# Patient Record
Sex: Female | Born: 1985 | Race: Black or African American | Hispanic: No | Marital: Single | Smoking: Never smoker
Health system: Southern US, Community
[De-identification: ages and names within clinical notes are randomized; demographics above are authoritative.]

## PROBLEM LIST (undated history)

## (undated) ENCOUNTER — Inpatient Hospital Stay (HOSPITAL_COMMUNITY): Payer: Self-pay

## (undated) DIAGNOSIS — I10 Essential (primary) hypertension: Secondary | ICD-10-CM

## (undated) DIAGNOSIS — D649 Anemia, unspecified: Secondary | ICD-10-CM

---
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
## 2004-09-11 ENCOUNTER — Emergency Department (HOSPITAL_COMMUNITY): Admission: EM | Admit: 2004-09-11 | Discharge: 2004-09-11 | Payer: Self-pay | Admitting: Emergency Medicine

## 2005-08-01 ENCOUNTER — Emergency Department (HOSPITAL_COMMUNITY): Admission: EM | Admit: 2005-08-01 | Discharge: 2005-08-01 | Payer: Self-pay | Admitting: Emergency Medicine

## 2005-08-05 ENCOUNTER — Other Ambulatory Visit: Admission: RE | Admit: 2005-08-05 | Discharge: 2005-08-05 | Payer: Self-pay | Admitting: Gynecology

## 2005-08-20 ENCOUNTER — Emergency Department (HOSPITAL_COMMUNITY): Admission: EM | Admit: 2005-08-20 | Discharge: 2005-08-20 | Payer: Self-pay | Admitting: Emergency Medicine

## 2006-03-27 ENCOUNTER — Emergency Department (HOSPITAL_COMMUNITY): Admission: EM | Admit: 2006-03-27 | Discharge: 2006-03-27 | Payer: Self-pay | Admitting: Emergency Medicine

## 2007-03-03 ENCOUNTER — Inpatient Hospital Stay (HOSPITAL_COMMUNITY): Admission: AD | Admit: 2007-03-03 | Discharge: 2007-03-03 | Payer: Self-pay | Admitting: Obstetrics and Gynecology

## 2007-09-09 ENCOUNTER — Inpatient Hospital Stay (HOSPITAL_COMMUNITY): Admission: AD | Admit: 2007-09-09 | Discharge: 2007-09-12 | Payer: Self-pay | Admitting: Obstetrics

## 2011-02-04 NOTE — Op Note (Signed)
Sarah Rios, Sarah Rios              ACCOUNT NO.:  [id number]   MEDICAL RECORD NO.:  [id number]          PATIENT TYPE:  INP   LOCATION:  9135                          FACILITY:  [facility]   PHYSICIAN:  Kathreen Cosier, M.D.DATE OF BIRTH:  1986/07/21   DATE OF PROCEDURE:  09/09/2007  DATE OF DISCHARGE:                               OPERATIVE REPORT   PREOPERATIVE DIAGNOSIS:  Previous cesarean section at term, in labor,  desires repeat.   POSTOPERATIVE DIAGNOSIS:  Previous cesarean section at term, in labor,  desires repeat.   SURGEON:  Kathreen Cosier, M.D.   FIRST ASSISTANT:  Charles A. Clearance Coots, M.D.   ANESTHESIA:  Spinal.   PROCEDURE IN DETAIL:  The patient was placed on the operating table in a  supine position after spinal administered.  Abdomen prepped and draped.  Bladder emptied with Foley catheter.  Transverse suprapubic incision  made through the old scar, carried down to rectus fascia.  Fascia  cleaned and incised the length of the incision.  Recti muscles retracted  laterally.  Peritoneum incised longitudinally.  Transverse incision made  in the visceral peritoneum above the bladder.  Bladder mobilized  inferiorly.  Transverse lower uterine incision made.  Fluid was meconium  stained.  Team was in attendance.  The Apgars 9 and 9.  We had a female  weighing 8 pounds 6 ounces from the OP position.  Placenta was posterior  and was removed manually and sent to labor and delivery.  Uterine cavity  cleaned with dry laps.  Uterine incision closed in 1 layer with  continuous suture of #1 chromic.  Hemostasis satisfactory.  Bladder flap  reattached with 2-0 chromic.  Uterus well contracted.  Tubes and ovaries  normal.  Abdomen closed in layers.  Peritoneum continuous suture of 0  chromic.  Fascia continuous suture of 0 Dexon and the skin closed with  subcuticular stitch of 4-0 Monocryl.  Addendum, the placenta was sent to  pathology.  Blood loss 800 mL.     ______________________________  Kathreen Cosier, M.D.     BAM/MEDQ  D:  09/09/2007  T:  09/10/2007  Job:  [id number]

## 2011-02-07 NOTE — Discharge Summary (Signed)
Sarah Rios, Sarah Rios              ACCOUNT NO.:  [id number]   MEDICAL RECORD NO.:  [id number]          PATIENT TYPE:  INP   LOCATION:  9135                          [facility]ILITY:  [facility]   PHYSICIAN:  Kathreen Cosier, M.D.DATE OF BIRTH:  1986-08-20   DATE OF ADMISSION:  09/09/2007  DATE OF DISCHARGE:  09/12/2007                               DISCHARGE SUMMARY   The patient is a 25 year old gravida 3, para 1-0-1-1, [facility] September 07, 2007.  She had a previous C-section and she was now at term and desired  repeat C-section.  On December 18, she underwent repeat low transverse  cesarean section.  She had a female, Apgars 9 and 9, from the OP position,  weighing 8 pounds 6 ounces.  Postoperatively, she did well.  Her  hemoglobin was 8.4, RPR negative, HIV negative, and she was started on  ferrous sulfate twice a day.  She was discharged on the third  postoperative day, ambulatory, on a regular diet, to see me in six  weeks.   DISCHARGE MEDICATIONS:  Ferrous sulfate twice a day and Tylox for pain.   DISCHARGE DIAGNOSES:  Status post repeat low transverse cesarean section  at term.           ______________________________  Kathreen Cosier, M.D.     BAM/MEDQ  D:  09/29/2007  T:  09/29/2007  Job:  [id number]

## 2011-06-27 LAB — CBC
HCT: 25.2 — ABNORMAL LOW
HCT: 35.8 — ABNORMAL LOW
Hemoglobin: 11.6 — ABNORMAL LOW
Hemoglobin: 8.4 — ABNORMAL LOW
MCHC: 32.4
MCHC: 33.5
MCV: 74.8 — ABNORMAL LOW
MCV: 75.7 — ABNORMAL LOW
Platelets: 156
Platelets: 197
RBC: 3.37 — ABNORMAL LOW
RBC: 4.73
RDW: 15.8 — ABNORMAL HIGH
RDW: 15.9 — ABNORMAL HIGH
WBC: 13.1 — ABNORMAL HIGH
WBC: 14 — ABNORMAL HIGH

## 2011-06-27 LAB — RPR: RPR Ser Ql: NONREACTIVE

## 2011-07-03 ENCOUNTER — Encounter (HOSPITAL_COMMUNITY): Payer: Self-pay

## 2011-07-03 ENCOUNTER — Emergency Department (HOSPITAL_COMMUNITY): Payer: BC Managed Care – PPO

## 2011-07-03 ENCOUNTER — Emergency Department (HOSPITAL_COMMUNITY)
Admission: EM | Admit: 2011-07-03 | Discharge: 2011-07-03 | Disposition: A | Discharge status: Home / Self Care | Payer: BC Managed Care – PPO | Attending: Emergency Medicine | Admitting: Emergency Medicine

## 2011-07-03 DIAGNOSIS — J189 Pneumonia, unspecified organism: Secondary | ICD-10-CM | POA: Insufficient documentation

## 2011-07-03 DIAGNOSIS — Z79899 Other long term (current) drug therapy: Secondary | ICD-10-CM | POA: Insufficient documentation

## 2011-07-03 DIAGNOSIS — I10 Essential (primary) hypertension: Secondary | ICD-10-CM | POA: Insufficient documentation

## 2011-07-03 DIAGNOSIS — R51 Headache: Secondary | ICD-10-CM | POA: Insufficient documentation

## 2011-07-03 LAB — URINALYSIS, ROUTINE W REFLEX MICROSCOPIC
Bilirubin Urine: NEGATIVE
Glucose, UA: NEGATIVE mg/dL
Hgb urine dipstick: NEGATIVE
Ketones, ur: NEGATIVE mg/dL
Nitrite: NEGATIVE
Protein, ur: NEGATIVE mg/dL
Specific Gravity, Urine: 1.016 (ref 1.005–1.030)
Urobilinogen, UA: 1 mg/dL (ref 0.0–1.0)
pH: 7 (ref 5.0–8.0)

## 2011-07-03 LAB — POCT PREGNANCY, URINE: Preg Test, Ur: NEGATIVE

## 2011-07-03 LAB — POCT I-STAT, CHEM 8
BUN: 6 mg/dL (ref 6–23)
Calcium, Ion: 1.2 mmol/L (ref 1.12–1.32)
Chloride: 106 mEq/L (ref 96–112)
Glucose, Bld: 88 mg/dL (ref 70–99)
HCT: 36 % (ref 36.0–46.0)
Hemoglobin: 12.2 g/dL (ref 12.0–15.0)
Potassium: 3.7 mEq/L (ref 3.5–5.1)
Sodium: 139 mEq/L (ref 135–145)

## 2011-07-03 LAB — URINE MICROSCOPIC-ADD ON

## 2011-07-03 LAB — POCT I-STAT TROPONIN I: Troponin i, poc: 0 ng/mL (ref 0.00–0.08)

## 2011-07-03 LAB — RAPID STREP SCREEN (MED CTR MEBANE ONLY): Streptococcus, Group A Screen (Direct): NEGATIVE

## 2011-07-03 MED ORDER — IOHEXOL 300 MG/ML  SOLN
100.0000 mL | Freq: Once | INTRAMUSCULAR | Status: AC | PRN
  Administered 2011-07-03: 100 mL via INTRAVENOUS

## 2011-07-10 LAB — URINALYSIS, ROUTINE W REFLEX MICROSCOPIC
Bilirubin Urine: NEGATIVE
Glucose, UA: NEGATIVE
Hgb urine dipstick: NEGATIVE
Ketones, ur: NEGATIVE
Nitrite: NEGATIVE
Protein, ur: NEGATIVE
Specific Gravity, Urine: 1.025
Urobilinogen, UA: 0.2
pH: 6

## 2011-07-10 LAB — GC/CHLAMYDIA PROBE AMP, GENITAL
Chlamydia, DNA Probe: NEGATIVE
GC Probe Amp, Genital: NEGATIVE

## 2011-07-10 LAB — WET PREP, GENITAL
Clue Cells Wet Prep HPF POC: NONE SEEN
Trich, Wet Prep: NONE SEEN

## 2011-07-10 LAB — URINE MICROSCOPIC-ADD ON

## 2012-01-01 ENCOUNTER — Encounter (HOSPITAL_COMMUNITY): Payer: Self-pay | Admitting: *Deleted

## 2012-01-01 ENCOUNTER — Emergency Department (HOSPITAL_COMMUNITY): Payer: BC Managed Care – PPO

## 2012-01-01 ENCOUNTER — Emergency Department (HOSPITAL_COMMUNITY)
Admission: EM | Admit: 2012-01-01 | Discharge: 2012-01-01 | Disposition: A | Discharge status: Home / Self Care | Payer: BC Managed Care – PPO | Attending: Emergency Medicine | Admitting: Emergency Medicine

## 2012-01-01 DIAGNOSIS — I10 Essential (primary) hypertension: Secondary | ICD-10-CM | POA: Insufficient documentation

## 2012-01-01 DIAGNOSIS — R0789 Other chest pain: Secondary | ICD-10-CM

## 2012-01-01 DIAGNOSIS — R071 Chest pain on breathing: Secondary | ICD-10-CM | POA: Insufficient documentation

## 2012-01-01 DIAGNOSIS — R110 Nausea: Secondary | ICD-10-CM | POA: Insufficient documentation

## 2012-01-01 DIAGNOSIS — Z79899 Other long term (current) drug therapy: Secondary | ICD-10-CM | POA: Insufficient documentation

## 2012-01-01 HISTORY — DX: Essential (primary) hypertension: I10

## 2012-01-01 LAB — CBC
HCT: 36.1 % (ref 36.0–46.0)
Hemoglobin: 11.3 g/dL — ABNORMAL LOW (ref 12.0–15.0)
MCH: 22.2 pg — ABNORMAL LOW (ref 26.0–34.0)
MCHC: 31.3 g/dL (ref 30.0–36.0)
MCV: 71.1 fL — ABNORMAL LOW (ref 78.0–100.0)
Platelets: 245 10*3/uL (ref 150–400)
RBC: 5.08 MIL/uL (ref 3.87–5.11)
RDW: 16.9 % — ABNORMAL HIGH (ref 11.5–15.5)
WBC: 5.2 10*3/uL (ref 4.0–10.5)

## 2012-01-01 LAB — DIFFERENTIAL
Basophils Relative: 0 % (ref 0–1)
Eosinophils Absolute: 0.1 10*3/uL (ref 0.0–0.7)
Lymphs Abs: 1.5 10*3/uL (ref 0.7–4.0)
Neutro Abs: 3 10*3/uL (ref 1.7–7.7)
Neutrophils Relative %: 58 % (ref 43–77)

## 2012-01-01 LAB — BASIC METABOLIC PANEL
BUN: 9 mg/dL (ref 6–23)
Calcium: 9.6 mg/dL (ref 8.4–10.5)
Chloride: 102 mEq/L (ref 96–112)
Creatinine, Ser: 0.75 mg/dL (ref 0.50–1.10)
GFR calc Af Amer: >90 mL/min (ref >90)
GFR calc non Af Amer: >90 mL/min (ref >90)
Potassium: 3.7 mEq/L (ref 3.5–5.1)
Sodium: 136 mEq/L (ref 135–145)

## 2012-01-01 LAB — URINALYSIS, ROUTINE W REFLEX MICROSCOPIC
Bilirubin Urine: NEGATIVE
Ketones, ur: NEGATIVE mg/dL
Nitrite: NEGATIVE
Urobilinogen, UA: 0.2 mg/dL (ref 0.0–1.0)

## 2012-01-01 LAB — TROPONIN I: Troponin I: <0.3 ng/mL (ref <0.30)

## 2012-01-01 LAB — D-DIMER, QUANTITATIVE: D-Dimer, Quant: 0.4 ug/mL-FEU (ref 0.00–0.48)

## 2012-01-01 MED ORDER — IOHEXOL 300 MG/ML  SOLN
100.0000 mL | Freq: Once | INTRAMUSCULAR | Status: AC | PRN
  Administered 2012-01-01: 100 mL via INTRAVENOUS

## 2012-01-01 MED ORDER — GI COCKTAIL ~~LOC~~
30.0000 mL | Freq: Once | ORAL | Status: AC
  Administered 2012-01-01: 30 mL via ORAL
  Filled 2012-01-01: qty 30

## 2012-01-01 MED ORDER — KETOROLAC TROMETHAMINE 60 MG/2ML IM SOLN
60.0000 mg | Freq: Once | INTRAMUSCULAR | Status: AC
  Administered 2012-01-01: 60 mg via INTRAMUSCULAR
  Filled 2012-01-01: qty 2

## 2012-01-01 MED ORDER — METHOCARBAMOL 500 MG PO TABS: 1000.0000 mg | ORAL_TABLET | Freq: Four times a day (QID) | ORAL | Status: AC | PRN

## 2012-01-01 MED ORDER — HYDROCODONE-ACETAMINOPHEN 5-325 MG PO TABS: ORAL_TABLET | ORAL | Status: AC

## 2012-01-01 NOTE — ED Provider Notes (Addendum)
History     CSN: [id number]  Arrival date & time 01/01/12  0800   First MD Initiated Contact with Patient 01/01/12 [phone]      Chief Complaint  Patient presents with  . Chest Pain     HPI Pt was seen at 0840.  Per pt, c/o gradual onset and persistence of constant left upper chest "pain" x2 days.  Pain worsens with laying down flat or laying on her left side.  Pt describes the pain as "heavy" and "pressure."  States her CP has been assoc with nausea and loose stools x3 days.  Denies black or blood in stools, no tingling/numbness in extremities, no neck or back pain, no palpitations, no SOB/cough, no fevers, no rash.       Past Medical History  Diagnosis Date  . Hypertension     Past Surgical History  Procedure Date  . Cesarean section     History  Substance Use Topics  . Smoking status: Never Smoker   . Smokeless tobacco: Not on file  . Alcohol Use: Yes     occa    Review of Systems ROS: Statement: All systems negative except as marked or noted in the HPI; Constitutional: Negative for fever and chills. ; ; Eyes: Negative for eye pain, redness and discharge. ; ; ENMT: Negative for ear pain, hoarseness, nasal congestion, sinus pressure and sore throat. ; ; Cardiovascular: +chest pain. Negative for palpitations, diaphoresis, dyspnea and peripheral edema. ; ; Respiratory: Negative for cough, wheezing and stridor. ; ; Gastrointestinal: +nausea, loose stools.  Negative for vomiting, diarrhea, abdominal pain, blood in stool, hematemesis, jaundice and rectal bleeding. . ; ; Genitourinary: Negative for dysuria, flank pain and hematuria. ; ; Musculoskeletal: Negative for back pain and neck pain. Negative for swelling and trauma.; ; Skin: Negative for pruritus, rash, abrasions, blisters, bruising and skin lesion.; ; Neuro: Negative for headache, lightheadedness and neck stiffness. Negative for weakness, altered level of consciousness , altered mental status, extremity weakness, paresthesias,  involuntary movement, seizure and syncope.     Allergies  Review of patient's allergies indicates no known allergies.  Home Medications   Current Outpatient Rx  Name Route Sig Dispense Refill  . IBUPROFEN 200 MG PO TABS Oral Take 600 mg by mouth every 6 (six) hours as needed. For pain    . METOPROLOL TARTRATE 25 MG PO TABS Oral Take 25 mg by mouth daily.      BP 126/75  Pulse 72  Temp(Src) 98.5 F (36.9 C) (Oral)  Resp 16  SpO2 100%  LMP 12/08/2011  Physical Exam 0845: Physical examination:  Nursing notes reviewed; Vital signs and O2 SAT reviewed;  Constitutional: Well developed, Well nourished, Well hydrated, In no acute distress; Head:  Normocephalic, atraumatic; Eyes: EOMI, PERRL, No scleral icterus; ENMT: TM's clear bilat. Mouth and pharynx normal, Mucous membranes moist; Neck: Supple, Full range of motion, No lymphadenopathy; Cardiovascular: Regular rate and rhythm, No murmur, rub, or gallop; Respiratory: Breath sounds clear & equal bilaterally, No rales, rhonchi, wheezes, or rub, Normal respiratory effort/excursion; Chest: +TTP left upper parasternal and anterior chest wall areas, no rash, Movement normal; Abdomen: Soft, Nontender, Nondistended, Normal bowel sounds; Extremities: Pulses normal, No tenderness, No edema, No calf edema or asymmetry.; Neuro: AA&Ox3, Major CN grossly intact.  No gross focal motor or sensory deficits in extremities.; Skin: Color normal, Warm, Dry, no rash.     ED Course  Procedures   1045:  Pt improved after meds.  States she did not  f/u after her last ED visit for CP on 06/2011 for repeat CT chest re: possible mass.  Will check today.  12:39 PM:  Continues improved, VS remain stable.  Has tol PO well.  No N/V or stooling while in ED, abd benign on exam.  Wants to go home now.  Doubt ACS as cause for symptoms given negative troponin and unchanged EKG after 2 days of constant symptoms.  Doubt PE with negative d-dimer, no hypoxia/tachycardia/tachypnea  on exam today, as well as grossly negative CT chest for PE as noted below.  Will tx symptomatically for now.  Pt encouraged to f/u with her PMD for good continuity of care.  Dx testing d/w pt.  Questions answered.  Verb understanding, agreeable to d/c home with outpt f/u.    MDM  MDM Reviewed: nursing note, previous chart and vitals Reviewed previous: ECG and CT scan Interpretation: ECG, labs and x-ray    Date: 01/01/2012  Rate: 74  Rhythm: normal sinus rhythm  QRS Axis: normal  Intervals: normal  ST/T Wave abnormalities: normal  Conduction Disutrbances:none  Narrative Interpretation:   Old EKG Reviewed: unchanged; no significant changes from previous EKG dated 07/03/2011.  Results for orders placed during the hospital encounter of 01/01/12  BASIC METABOLIC PANEL      Component Value Range   Sodium 136  135 - 145 (mEq/L)   Potassium 3.7  3.5 - 5.1 (mEq/L)   Chloride 102  96 - 112 (mEq/L)   CO2 28  19 - 32 (mEq/L)   Glucose, Bld 78  70 - 99 (mg/dL)   BUN 9  6 - 23 (mg/dL)   Creatinine, Ser 9.56  0.50 - 1.10 (mg/dL)   Calcium 9.6  8.4 - 21.3 (mg/dL)   GFR calc non Af Amer >90  >90 (mL/min)   GFR calc Af Amer >90  >90 (mL/min)  CBC      Component Value Range   WBC 5.2  4.0 - 10.5 (K/uL)   RBC 5.08  3.87 - 5.11 (MIL/uL)   Hemoglobin 11.3 (*) 12.0 - 15.0 (g/dL)   HCT 08.6  57.8 - 46.9 (%)   MCV 71.1 (*) 78.0 - 100.0 (fL)   MCH 22.2 (*) 26.0 - 34.0 (pg)   MCHC 31.3  30.0 - 36.0 (g/dL)   RDW 62.9 (*) 52.8 - 15.5 (%)   Platelets 245  150 - 400 (K/uL)  DIFFERENTIAL      Component Value Range   Neutrophils Relative 58  43 - 77 (%)   Neutro Abs 3.0  1.7 - 7.7 (K/uL)   Lymphocytes Relative 29  12 - 46 (%)   Lymphs Abs 1.5  0.7 - 4.0 (K/uL)   Monocytes Relative 10  3 - 12 (%)   Monocytes Absolute 0.5  0.1 - 1.0 (K/uL)   Eosinophils Relative 3  0 - 5 (%)   Eosinophils Absolute 0.1  0.0 - 0.7 (K/uL)   Basophils Relative 0  0 - 1 (%)   Basophils Absolute 0.0  0.0 - 0.1 (K/uL)    D-DIMER, QUANTITATIVE      Component Value Range   D-Dimer, Quant 0.40  0.00 - 0.48 (ug/mL-FEU)  TROPONIN I      Component Value Range   Troponin I <0.30  <0.30 (ng/mL)  URINALYSIS, ROUTINE W REFLEX MICROSCOPIC      Component Value Range   Color, Urine YELLOW  YELLOW    APPearance CLOUDY (*) CLEAR    Specific Gravity, Urine 1.024  1.005 -  1.030    pH 8.0  5.0 - 8.0    Glucose, UA NEGATIVE  NEGATIVE (mg/dL)   Hgb urine dipstick NEGATIVE  NEGATIVE    Bilirubin Urine NEGATIVE  NEGATIVE    Ketones, ur NEGATIVE  NEGATIVE (mg/dL)   Protein, ur NEGATIVE  NEGATIVE (mg/dL)   Urobilinogen, UA 0.2  0.0 - 1.0 (mg/dL)   Nitrite NEGATIVE  NEGATIVE    Leukocytes, UA NEGATIVE  NEGATIVE   PREGNANCY, URINE      Component Value Range   Preg Test, Ur NEGATIVE  NEGATIVE    Dg Chest 2 View 01/01/2012  *RADIOLOGY REPORT*  Clinical Data: Left chest pain.  CHEST - 2 VIEW  Comparison: 07/03/2011  Findings: Heart and mediastinal contours are within normal limits. No focal opacities or effusions.  No acute bony abnormality.  IMPRESSION: No active cardiopulmonary disease.  Original Report Authenticated By: Cyndie Chime, M.D.   Ct Chest W Contrast 01/01/2012  *RADIOLOGY REPORT*  Clinical Data: Chest pain, history of abnormal CT  CT CHEST WITH CONTRAST  Technique:  Multidetector CT imaging of the chest was performed following the standard protocol during bolus administration of intravenous contrast.  Contrast: OMNIPAQUE IOHEXOL 300 MG/ML  SOLN  Comparison: Chest CT - 07/03/2011  Findings:  Interval resolution of previously noted airspace opacity within the medial aspect of the left upper lobe.  No new focal airspace opacities.  No pleural effusion or pneumothorax. No mediastinal, hilar or axillary lymphadenopathy.  Normal heart size.  No pericardial effusion.  Evaluation of the ascending aorta is limited secondary to pulsation artifact.  Normal configuration of the aortic arch.  The visualized portions of the  cervical vasculature patent.  Normal caliber of the thoracic aorta.  Although this examination was not tailored for evaluation of the pulmonary arteries, there is no discrete filling defect within the main pulmonary arteries to suggest acute pulmonary embolism. Normal caliber of the main pulmonary artery.  Limited evaluation of the upper abdomen is normal.  No acute or aggressive osseous abnormality.  IMPRESSION: 1.  Interval resolution of the left upper lobe airspace opacity compatible with resolved infection.  2.  No acute cardiopulmonary disease.  Original Report Authenticated By: Waynard Reeds, M.D.                Laray Anger, DO 01/02/12 2019

## 2012-01-01 NOTE — ED Notes (Signed)
Pt. States pain started Tues. Worse lying down or lying on side. No pain during the day yesterday. "Faint pain right now. Would feel like a brick on my chest if I laid down". No SOB. Pt states "I had nausea earlier this morning, but not right now". No vomiting.  Pt. Reports loose stools x 3 days. Headache on left side above eye. A.o.x3 NAD.

## 2012-01-01 NOTE — Discharge Instructions (Signed)
RESOURCE GUIDE  Dental Problems  Patients with Medicaid: [facility]                     [facility] [address]                                           [address] Phone:  [phone]                                                  Phone:  [phone]  If unable to pay or uninsured, contact:  [facility] or [facility] to become qualified for the adult dental clinic.  Chronic Pain Problems Contact [facility]  [phone] Patients need to be referred by their primary care doctor.  Insufficient Money for Medicine Contact [employer]:  call "211" or [facility] 271-5999.  No Primary Care Doctor Call [facility]  832-8000 Other agencies that provide inexpensive medical care    [facility]  [phone]    [facility]  [phone]    [facility]  [phone]    Women[facility] Clinic  [phone]    [facility]  [phone]    [facility]  [phone]  Psychological Services [facility]  [phone] [facility]  [phone] [facility]   [phone] (emergency services 641-4993)  Substance Abuse Resources [facility]  336-882-2125 [facility] 336-784-9470 [facility] 336-285-9073 [facility] 336-845-3988 Residential & Outpatient Substance Abuse Program  800-659-3381  Abuse/Neglect [place] Child Abuse Hotline (336) 641-3795 [place] Child Abuse Hotline 800-378-5315 (After Hours)  Emergency Shelter [facility] (336) 271-5985  Maternity Homes [facility] (336) 275-9566 [facility] (704) 372-4663  MRSA Hotline #:   832-7006    [place] [facility]  [facility] [place]     [employer]                          [facility] [address] [place]                       [address]      [address]  [place]                                                [place]                            [place] Phone:  [phone]                                   Phone:  [phone]                 Phone:  [phone]  [facility] Phone:  [phone]    [facility] Child Abuse Hotline 925 804 3606 (531)033-0598 (After Hours)   Take the prescriptions as directed.  Apply moist heat or ice to the area(s) of discomfort, for 15 minutes at a time, several times per day for the next few days.  Do not fall asleep on a heating or ice pack.  Call your regular medical doctor today to schedule a follow up appointment within the next 4 days.  Return to the Emergency Department immediately if worsening.

## 2012-01-01 NOTE — ED Notes (Signed)
Pt reports mid-upper chest pain that started Tuesday night, pain is worse when laying down or when laying on her side.  Pt reports no pain during the day.  Pt denies any SOB or dizziness at this time.  Pt reports this am, pain is still there and developed h/a with it.  Pt denies any chest at present but reports h/a,.

## 2013-04-22 ENCOUNTER — Encounter (HOSPITAL_COMMUNITY): Payer: Self-pay

## 2013-04-22 ENCOUNTER — Emergency Department (HOSPITAL_COMMUNITY)
Admission: EM | Admit: 2013-04-22 | Discharge: 2013-04-22 | Disposition: A | Discharge status: Home / Self Care | Payer: BC Managed Care – PPO | Attending: Emergency Medicine | Admitting: Emergency Medicine

## 2013-04-22 DIAGNOSIS — R209 Unspecified disturbances of skin sensation: Secondary | ICD-10-CM | POA: Insufficient documentation

## 2013-04-22 DIAGNOSIS — M792 Neuralgia and neuritis, unspecified: Secondary | ICD-10-CM

## 2013-04-22 DIAGNOSIS — I10 Essential (primary) hypertension: Secondary | ICD-10-CM | POA: Insufficient documentation

## 2013-04-22 DIAGNOSIS — Z79899 Other long term (current) drug therapy: Secondary | ICD-10-CM | POA: Insufficient documentation

## 2013-04-22 DIAGNOSIS — IMO0002 Reserved for concepts with insufficient information to code with codable children: Secondary | ICD-10-CM | POA: Insufficient documentation

## 2013-04-22 MED ORDER — IBUPROFEN 800 MG PO TABS
800.0000 mg | ORAL_TABLET | Freq: Once | ORAL | Status: AC
  Administered 2013-04-22: 800 mg via ORAL
  Filled 2013-04-22: qty 1

## 2013-04-22 MED ORDER — NAPROXEN 500 MG PO TABS: 500.0000 mg | ORAL_TABLET | Freq: Two times a day (BID) | ORAL | Status: DC

## 2013-04-22 MED ORDER — CYCLOBENZAPRINE HCL 10 MG PO TABS: 10.0000 mg | ORAL_TABLET | Freq: Three times a day (TID) | ORAL | Status: DC | PRN

## 2013-04-22 NOTE — ED Provider Notes (Signed)
CSN: [id number]     Arrival date & time 04/22/13  0000 History     First MD Initiated Contact with Patient 04/22/13 [phone]     Chief Complaint  Patient presents with  . Arm Pain   HPI  History provided by the patient. The patient is a 27 year old female with history of hypertension who presents with complaints of right upper extremity pain and slight numbness. Symptoms came on acutely around 9 PM while she was at home at rest. Pain travels from her right shoulder, back and all laydown her arm to the forearm. Denies pain in the hand or fingers. There is a slight tingling and numbness sensation. Patient has never had similar symptoms previously. Denies any injury or trauma. The patient did to some increased activity with her arms earlier braiding hair but did not fill this caused any particular injury or strain. Pains are worse with some movements and with palpation of the arm. She has not used any medications for symptoms. No other aggravating or alleviating factors. She denies any chest pain, shortness of breath, neck pain or stiffness. No headache. No recent fever, chills or sweats.    Past Medical History  Diagnosis Date  . Hypertension    Past Surgical History  Procedure Laterality Date  . Cesarean section     Family History  Problem Relation Age of Onset  . Adopted: Yes   History  Substance Use Topics  . Smoking status: Never Smoker   . Smokeless tobacco: Not on file  . Alcohol Use: Yes     Comment: occa   OB History   Grav Para Term Preterm Abortions TAB SAB Ect Mult Living                 Review of Systems  Constitutional: Negative for fever, chills and diaphoresis.  Respiratory: Negative for cough and shortness of breath.   Cardiovascular: Negative for chest pain, palpitations and leg swelling.  Gastrointestinal: Negative for nausea.  All other systems reviewed and are negative.    Allergies  Review of patient's allergies indicates no known allergies.  Home  Medications   Current Outpatient Rx  Name  Route  Sig  Dispense  Refill  . ibuprofen (ADVIL,MOTRIN) 200 MG tablet   Oral   Take 600 mg by mouth every 6 (six) hours as needed. For pain         . metoprolol tartrate (LOPRESSOR) 25 MG tablet   Oral   Take 25 mg by mouth daily.          BP 165/86  Pulse 77  Temp(Src) 98.4 F (36.9 C) (Oral)  Resp 18  Ht 5\' 1"  (1.549 m)  Wt 190 lb (86.183 kg)  BMI 35.92 kg/m2  SpO2 100%  LMP 04/21/2013 Physical Exam  Nursing note and vitals reviewed. Constitutional: She is oriented to person, place, and time. She appears well-developed and well-nourished. No distress.  HENT:  Head: Normocephalic.  Neck: Normal range of motion. Neck supple.  No cervical midline tenderness  Cardiovascular: Normal rate and regular rhythm.   No murmur heard. Pulmonary/Chest: Effort normal and breath sounds normal. No respiratory distress. She has no wheezes. She has no rales.  Abdominal: Soft.  Musculoskeletal: She exhibits tenderness. She exhibits no edema.  Mild to moderate tenderness along the right forearm and upper arm. Moderate to more significant tenderness along the right trapezius. There is no deformity or acute spasm. No cervical paraspinous tenderness.  Neurological: She is alert and oriented to  person, place, and time.  Skin: Skin is warm and dry. No rash noted.  Psychiatric: She has a normal mood and affect. Her behavior is normal.    ED Course   Procedures    1. Radicular pain in right arm     MDM  Patient seen and evaluated. Patient appears well she is holding her right arm and appears in moderate discomfort. No acute distress.  Patient states she was mostly concerned for any possibilities of a heart attack. Denies any other symptoms besides arm pain. She does have history of hypertension but no other significant risk factors. Have agreed with her to perform ECG.      Date: 04/22/2013  Rate: 72  Rhythm: normal sinus rhythm  QRS  Axis: normal  Intervals: normal  ST/T Wave abnormalities: normal  Conduction Disutrbances:none  Narrative Interpretation:   Old EKG Reviewed: unchanged    Angus Seller, PA-C 04/22/13 [phone]

## 2013-04-22 NOTE — ED Notes (Signed)
Pt complains of right arm pain since 9pm, no injury noted

## 2013-04-22 NOTE — ED Provider Notes (Signed)
Medical screening examination/treatment/procedure(s) were performed by non-physician practitioner and as supervising physician I was immediately available for consultation/collaboration.  Duanne Duchesne M Donevin Sainsbury, MD 04/22/13 0524 

## 2015-12-28 ENCOUNTER — Emergency Department (HOSPITAL_BASED_OUTPATIENT_CLINIC_OR_DEPARTMENT_OTHER)
Admission: EM | Admit: 2015-12-28 | Discharge: 2015-12-28 | Disposition: A | Discharge status: Home / Self Care | Payer: BLUE CROSS/BLUE SHIELD | Attending: Emergency Medicine | Admitting: Emergency Medicine

## 2015-12-28 ENCOUNTER — Encounter (HOSPITAL_BASED_OUTPATIENT_CLINIC_OR_DEPARTMENT_OTHER): Payer: Self-pay | Admitting: *Deleted

## 2015-12-28 DIAGNOSIS — Z791 Long term (current) use of non-steroidal anti-inflammatories (NSAID): Secondary | ICD-10-CM | POA: Diagnosis not present

## 2015-12-28 DIAGNOSIS — I159 Secondary hypertension, unspecified: Secondary | ICD-10-CM | POA: Diagnosis not present

## 2015-12-28 DIAGNOSIS — I10 Essential (primary) hypertension: Secondary | ICD-10-CM | POA: Diagnosis present

## 2015-12-28 MED ORDER — METOPROLOL TARTRATE 50 MG PO TABS
50.0000 mg | ORAL_TABLET | Freq: Once | ORAL | Status: AC
  Administered 2015-12-28: 50 mg via ORAL
  Filled 2015-12-28: qty 1

## 2015-12-28 MED ORDER — METOPROLOL TARTRATE 50 MG PO TABS: 50.0000 mg | ORAL_TABLET | Freq: Every day | ORAL | Status: DC

## 2015-12-28 NOTE — ED Notes (Signed)
She ran out of her BP medication 2 weeks ago. [facility] and her BP was elevated and she was told to come here.

## 2015-12-28 NOTE — ED Provider Notes (Signed)
CSN: [id number]     Arrival date & time 12/28/15  1843 History  By signing my name below, I, [name], attest that this documentation has been prepared under the direction and in the presence of Marily MemosJason Kobee Medlen, MD. Electronically Signed: [name], ED Scribe. 12/28/2015. 7:18 PM.    Chief Complaint  Patient presents with  . Hypertension   The history is provided by the patient. No language interpreter was used.   HPI Comments: Sarah Rios is a 30 y.o. female with h/o HTN on 50 mg qd Lopressor who presents to the Emergency Department complaining of mild HA onset today with associated right arm paresthesia. Per pt, she ran out of her metoprolol 2 weeks ago and was unable to schedule an appointment with her PCP, so she went to the [facility] for a refill today. She was then referred here after her BP was elevated and she complained of HA. Pt states her current HA is typical for her with HTN. Denies CP, visual disturbance, SOB, dysuria, leg swelling.   Past Medical History  Diagnosis Date  . Hypertension    Past Surgical History  Procedure Laterality Date  . Cesarean section     Family History  Problem Relation Age of Onset  . Adopted: Yes   Social History  Substance Use Topics  . Smoking status: Never Smoker   . Smokeless tobacco: None  . Alcohol Use: Yes     Comment: occa   OB History    No data available     Review of Systems  Eyes: Negative for visual disturbance.  Respiratory: Negative for shortness of breath.   Cardiovascular: Negative for chest pain and leg swelling.  Genitourinary: Negative for dysuria.  Neurological: Positive for headaches.  All other systems reviewed and are negative.  Allergies  Review of patient's allergies indicates no known allergies.  Home Medications   Prior to Admission medications   Medication Sig Start Date End Date Taking? Authorizing Provider  cyclobenzaprine (FLEXERIL) 10 MG tablet Take 1 tablet (10 mg total) by mouth 3  (three) times daily as needed for muscle spasms. 04/22/13   Ivonne AndrewPeter Dammen, PA-C  ibuprofen (ADVIL,MOTRIN) 200 MG tablet Take 600 mg by mouth every 6 (six) hours as needed. For pain    Historical Provider, MD  metoprolol (LOPRESSOR) 50 MG tablet Take 1 tablet (50 mg total) by mouth daily. 12/28/15   Marily MemosJason Kimora Stankovic, MD  naproxen (NAPROSYN) 500 MG tablet Take 1 tablet (500 mg total) by mouth 2 (two) times daily. 04/22/13   Peter Dammen, PA-C   BP 154/106 mmHg  Pulse 66  Temp(Src) 98.2 F (36.8 C) (Oral)  Resp 18  Ht 5\' 2"  (1.575 m)  Wt 180 lb (81.647 kg)  BMI 32.91 kg/m2  SpO2 100%  LMP 12/07/2015 Physical Exam  Constitutional: She is oriented to person, place, and time. She appears well-developed and well-nourished.  HENT:  Head: Normocephalic and atraumatic.  Eyes: Conjunctivae and EOM are normal. Pupils are equal, round, and reactive to light.  Neck: Normal range of motion. Neck supple.  Cardiovascular: Normal rate, regular rhythm and normal heart sounds.  Exam reveals no gallop and no friction rub.   No murmur heard. Pulmonary/Chest: Effort normal and breath sounds normal. No respiratory distress. She has no wheezes. She has no rales.  Abdominal: Soft. She exhibits no distension and no mass. There is no tenderness. There is no rebound and no guarding.  Genitourinary:  No CVA tenderness.   Musculoskeletal: Normal  range of motion. She exhibits no edema.  No lower extremity edema.   Neurological: She is alert and oriented to person, place, and time. She has normal reflexes. She displays normal reflexes.  Strength and sensation equal and intact bilaterally throughout the upper and lower extremities. DTRs intact.   Skin: Skin is warm and dry.  Psychiatric: She has a normal mood and affect. Her behavior is normal.  Nursing note and vitals reviewed.   ED Course  Procedures (including critical care time) DIAGNOSTIC STUDIES: Oxygen Saturation is 100% on RA, normal by my interpretation.     COORDINATION OF CARE: 7:17 PM Discussed treatment plan with pt at bedside which includes medication refill, EKG and pt agreed to plan.     EKG Interpretation   Date/Time:  Friday December 28 2015 19:13:52 EDT Ventricular Rate:  71 PR Interval:  184 QRS Duration: 91 QT Interval:  381 QTC Calculation: 414 R Axis:   1 Text Interpretation:  Sinus rhythm RSR' in V1 or V2, probably normal  variant Baseline wander in lead(s) V4 No significant change since last  tracing Confirmed by [facility] MD, Barbara Cower [phone]) on 12/28/2015 9:13:23 PM      MDM   Final diagnoses:  Secondary hypertension, unspecified   Symptoms improved with BP. Suspect relationship. Doubt end organ damage. Refilled meds, patient will work on getting PCP follow up for further management.   New Prescriptions: Discharge Medication List as of 12/28/2015  9:57 PM     I have personally and contemperaneously reviewed labs and imaging and used in my decision making as above.   A medical screening exam was performed and I feel the patient has had an appropriate workup for their chief complaint at this time and likelihood of emergent condition existing is low. Their vital signs are stable. They have been counseled on decision, discharge, follow up and which symptoms necessitate immediate return to the emergency department.  They verbally stated understanding and agreement with plan and discharged in stable condition.    I personally performed the services described in this documentation, which was scribed in my presence. The recorded information has been reviewed and is accurate.    Marily Memos, MD 12/29/15 [phone]

## 2015-12-28 NOTE — ED Notes (Signed)
MD at bedside. 

## 2016-03-29 ENCOUNTER — Encounter (HOSPITAL_BASED_OUTPATIENT_CLINIC_OR_DEPARTMENT_OTHER): Payer: Self-pay | Admitting: Emergency Medicine

## 2016-03-29 ENCOUNTER — Emergency Department (HOSPITAL_BASED_OUTPATIENT_CLINIC_OR_DEPARTMENT_OTHER): Payer: BLUE CROSS/BLUE SHIELD

## 2016-03-29 ENCOUNTER — Emergency Department (HOSPITAL_BASED_OUTPATIENT_CLINIC_OR_DEPARTMENT_OTHER)
Admission: EM | Admit: 2016-03-29 | Discharge: 2016-03-30 | Disposition: A | Discharge status: Home / Self Care | Payer: BLUE CROSS/BLUE SHIELD | Attending: Emergency Medicine | Admitting: Emergency Medicine

## 2016-03-29 DIAGNOSIS — I10 Essential (primary) hypertension: Secondary | ICD-10-CM | POA: Insufficient documentation

## 2016-03-29 DIAGNOSIS — O209 Hemorrhage in early pregnancy, unspecified: Secondary | ICD-10-CM | POA: Diagnosis present

## 2016-03-29 DIAGNOSIS — Z3A01 Less than 8 weeks gestation of pregnancy: Secondary | ICD-10-CM | POA: Insufficient documentation

## 2016-03-29 DIAGNOSIS — Z79899 Other long term (current) drug therapy: Secondary | ICD-10-CM | POA: Insufficient documentation

## 2016-03-29 DIAGNOSIS — O4690 Antepartum hemorrhage, unspecified, unspecified trimester: Secondary | ICD-10-CM

## 2016-03-29 DIAGNOSIS — O034 Incomplete spontaneous abortion without complication: Secondary | ICD-10-CM | POA: Diagnosis not present

## 2016-03-29 LAB — CBC WITH DIFFERENTIAL/PLATELET
BASOS PCT: 0 %
Basophils Absolute: 0 10*3/uL (ref 0.0–0.1)
EOS ABS: 0 10*3/uL (ref 0.0–0.7)
EOS PCT: 0 %
HCT: 32.6 % — ABNORMAL LOW (ref 36.0–46.0)
Hemoglobin: 10.6 g/dL — ABNORMAL LOW (ref 12.0–15.0)
LYMPHS ABS: 1 10*3/uL (ref 0.7–4.0)
Lymphocytes Relative: 11 %
MCH: 22.9 pg — AB (ref 26.0–34.0)
MCHC: 32.5 g/dL (ref 30.0–36.0)
MCV: 70.6 fL — ABNORMAL LOW (ref 78.0–100.0)
MONOS PCT: 7 %
Monocytes Absolute: 0.7 10*3/uL (ref 0.1–1.0)
Neutro Abs: 7.9 10*3/uL — ABNORMAL HIGH (ref 1.7–7.7)
Neutrophils Relative %: 82 %
PLATELETS: 236 10*3/uL (ref 150–400)
RBC: 4.62 MIL/uL (ref 3.87–5.11)
RDW: 17.1 % — ABNORMAL HIGH (ref 11.5–15.5)
WBC: 9.6 10*3/uL (ref 4.0–10.5)

## 2016-03-29 LAB — BASIC METABOLIC PANEL
Anion gap: 9 (ref 5–15)
BUN: 8 mg/dL (ref 6–20)
CALCIUM: 9.2 mg/dL (ref 8.9–10.3)
CO2: 23 mmol/L (ref 22–32)
CREATININE: 0.66 mg/dL (ref 0.44–1.00)
Chloride: 105 mmol/L (ref 101–111)
Glucose, Bld: 97 mg/dL (ref 65–99)
Potassium: 3.3 mmol/L — ABNORMAL LOW (ref 3.5–5.1)
SODIUM: 137 mmol/L (ref 135–145)

## 2016-03-29 LAB — URINALYSIS, ROUTINE W REFLEX MICROSCOPIC
BILIRUBIN URINE: NEGATIVE
GLUCOSE, UA: NEGATIVE mg/dL
KETONES UR: 15 mg/dL — AB
LEUKOCYTES UA: NEGATIVE
NITRITE: NEGATIVE
PH: 5.5 (ref 5.0–8.0)
PROTEIN: NEGATIVE mg/dL
Specific Gravity, Urine: 1.027 (ref 1.005–1.030)

## 2016-03-29 LAB — URINE MICROSCOPIC-ADD ON

## 2016-03-29 LAB — WET PREP, GENITAL
CLUE CELLS WET PREP: NONE SEEN
Sperm: NONE SEEN
Trich, Wet Prep: NONE SEEN
WBC, Wet Prep HPF POC: NONE SEEN
Yeast Wet Prep HPF POC: NONE SEEN

## 2016-03-29 LAB — HCG, QUANTITATIVE, PREGNANCY: HCG, BETA CHAIN, QUANT, S: 8875 m[IU]/mL — AB (ref <5)

## 2016-03-29 LAB — PREGNANCY, URINE: Preg Test, Ur: POSITIVE — AB

## 2016-03-29 NOTE — ED Provider Notes (Signed)
CSN: [id number]     Arrival date & time 03/29/16  2010 History  By signing my name below, I, Sarah Rios, attest that this documentation has been prepared under the direction and in the presence of [employer], PA-C. Electronically Signed: Evon Rios, ED Scribe. 03/29/2016. 8:53 PM.      Chief Complaint  Patient presents with  . Vaginal Bleeding   The history is provided by the patient. No language interpreter was used.   HPI Comments: Sarah Rios is a 30 y.o. female who presents to the Emergency Department complaining of vaginal bleeding onset June 12th, 2016. She reports associated abdominal pain and nausea.  Pt reports that she did have positive home pregnancy test early June 2016. She reports that the bleeding initially began on June 12th and lasted until July 2nd. She states that bleeding had resolved for about 3 days and returned again on July 5th. She reports heavy bleeding today. She states she is changing her tampon every hour. Pt states she is also passing clots. LNMP May 2016. Pt denies dizziness light headedness or vomiting. #[id number]   Past Medical History  Diagnosis Date  . Hypertension    Past Surgical History  Procedure Laterality Date  . Cesarean section     Family History  Problem Relation Age of Onset  . Adopted: Yes   Social History  Substance Use Topics  . Smoking status: Never Smoker   . Smokeless tobacco: None  . Alcohol Use: Yes     Comment: occa   OB History    No data available     Review of Systems A complete 10 system review of systems was obtained and all systems are negative except as noted in the HPI and PMH.      Allergies  Review of patient's allergies indicates no known allergies.  Home Medications   Prior to Admission medications   Medication Sig Start Date End Date Taking? Authorizing Provider  cyclobenzaprine (FLEXERIL) 10 MG tablet Take 1 tablet (10 mg total) by mouth 3 (three) times daily as needed for muscle  spasms. 04/22/13   Ivonne Andrew, PA-C  ibuprofen (ADVIL,MOTRIN) 200 MG tablet Take 600 mg by mouth every 6 (six) hours as needed. For pain    Historical Provider, MD  metoprolol (LOPRESSOR) 50 MG tablet Take 1 tablet (50 mg total) by mouth daily. 12/28/15   Marily Memos, MD  naproxen (NAPROSYN) 500 MG tablet Take 1 tablet (500 mg total) by mouth 2 (two) times daily. 04/22/13   Peter Dammen, PA-C   BP 127/91 mmHg  Pulse 82  Temp(Src) 98.1 F (36.7 C) (Oral)  Resp 18  Ht  (1.575 m)  Wt 180 lb (81.647 kg)  BMI 32.91 kg/m2  SpO2 100%   Physical Exam  Constitutional: She is oriented to person, place, and time. She appears well-developed and well-nourished. No distress.  HENT:  Head: Normocephalic and atraumatic.  Eyes: Conjunctivae and EOM are normal.  Neck: Normal range of motion. Neck supple.  Cardiovascular: Normal rate and regular rhythm.   Pulmonary/Chest: Effort normal and breath sounds normal. No respiratory distress.  Abdominal: Soft. Bowel sounds are normal. She exhibits no distension and no mass. There is tenderness. There is no rebound and no guarding.  Diffuse TTP of lower abdomen. No tenderness of upper abdomen.   Genitourinary: Cervix exhibits no motion tenderness. Right adnexum displays no mass, no tenderness and no fullness. Left adnexum displays no mass, no tenderness and no fullness.  Cervical os  closed Moderate amount of dark red blood in the vaginal vault, no blood clots visualized  Musculoskeletal: Normal range of motion.  Neurological: She is alert and oriented to person, place, and time.  Skin: Skin is warm and dry.  Psychiatric: She has a normal mood and affect. Her behavior is normal.  Nursing note and vitals reviewed.   ED Course  Procedures (including critical care time) DIAGNOSTIC STUDIES: Oxygen Saturation is 100% on RA, normal by my interpretation.    COORDINATION OF CARE: 8:53 PM-Discussed treatment plan which includes UA with pt at bedside and pt  agreed to plan.     Labs Review Labs Reviewed  URINALYSIS, ROUTINE W REFLEX MICROSCOPIC (NOT AT [facility])  PREGNANCY, URINE    Imaging Review [place] Ob Comp Less 14 Wks  03/29/2016  CLINICAL DATA:  Acute onset of vaginal bleeding.  Initial encounter. EXAM: OBSTETRIC <14 WK US AND TRANSVAGINAL OB US TECHNIQUE: Both transabdominal and transvaginal ultrasound examinations were performed for complete evaluation of the gestation as well as the maternal uterus, adnexal regions, and pelvic cul-de-sac. Transvaginal technique was performed to assess early pregnancy. COMPARISON:  None. FINDINGS: Intrauterine gestational sac: None seen. Yolk sac:  N/A Embryo:  N/A Subchorionic hemorrhage:  None visualized. Maternal uterus/adnexae: Heterogeneous material is noted within the endometrial echo complex. The echo complex measures up to 2.3 cm. This could reflect clot. The adnexa are unremarkable in appearance. The right ovary measures 4.0 x 2.0 x 2.8 cm, while the left ovary measures 4.5 x 1.9 x 2.1 cm. No suspicious adnexal masses are seen; there is no evidence for ovarian torsion. Trace free fluid is seen within the pelvic cul-de-sac. IMPRESSION: No intrauterine gestational sac seen. No evidence for ectopic pregnancy at this time. Would follow-up quantitative HCG level. If it remains below 1,000, this would remain within normal limits, and follow-up pelvic ultrasound would be indicated if quantitative beta HCG level continues to rise. Otherwise, this would reflect spontaneous abortion, given apparent clot within the endometrial echo complex. Electronically Signed   By: Roanna RaiderJeffery  Chang M.D.   On: 03/29/2016 22:27   [place] Ob Transvaginal  03/29/2016  CLINICAL DATA:  Acute onset of vaginal bleeding.  Initial encounter. EXAM: OBSTETRIC <14 WK US AND TRANSVAGINAL OB US TECHNIQUE: Both transabdominal and transvaginal ultrasound examinations were performed for complete evaluation of the gestation as well as the maternal uterus, adnexal  regions, and pelvic cul-de-sac. Transvaginal technique was performed to assess early pregnancy. COMPARISON:  None. FINDINGS: Intrauterine gestational sac: None seen. Yolk sac:  N/A Embryo:  N/A Subchorionic hemorrhage:  None visualized. Maternal uterus/adnexae: Heterogeneous material is noted within the endometrial echo complex. The echo complex measures up to 2.3 cm. This could reflect clot. The adnexa are unremarkable in appearance. The right ovary measures 4.0 x 2.0 x 2.8 cm, while the left ovary measures 4.5 x 1.9 x 2.1 cm. No suspicious adnexal masses are seen; there is no evidence for ovarian torsion. Trace free fluid is seen within the pelvic cul-de-sac. IMPRESSION: No intrauterine gestational sac seen. No evidence for ectopic pregnancy at this time. Would follow-up quantitative HCG level. If it remains below 1,000, this would remain within normal limits, and follow-up pelvic ultrasound would be indicated if quantitative beta HCG level continues to rise. Otherwise, this would reflect spontaneous abortion, given apparent clot within the endometrial echo complex. Electronically Signed   By: Roanna RaiderJeffery  Chang M.D.   On: 03/29/2016 22:27      EKG Interpretation None     12:23  AM Discussed patient with Dr Vergie Living with OB/GYN.  He recommends having her follow up in 48 hours to have beta quant rechecked. MDM   Final diagnoses:  None  Patient presents today with vaginal bleeding.  She reports having a positive pregnancy test in early June.  She states that she began having the bleeding June 12.  The bleeding then resolved for a couple of days and then returned July 5.  On exam, her cervical os is closed.  She does have moderate amount of blood in the vaginal vault, but no clots observed.  She does not appear to be in significant pain.  Hemoglobin is slightly low, but is around her baseline.  She is hemodynamically stable.  Ultrasound results as above.  Beta quant today is over 8000.  She is Rh positive.   Discussed the patient and ultrasound with Dr. Vergie Living with OB/GYN.  He states that she can follow up in 2 days to have the beta quant rechecked.  Patient appears to be stable for discharge.  Strict return precautions given.    I personally performed the services described in this documentation, which was scribed in my presence. The recorded information has been reviewed and is accurate.      Santiago Glad, PA-C 03/31/16 0036  Doug Sou, MD 03/31/16 [phone]

## 2016-03-29 NOTE — ED Notes (Signed)
PT unable to void at this time. 

## 2016-03-29 NOTE — ED Notes (Signed)
Pt in c/o vaginal bleeding, states several weeks ago she thought she was having a miscarriage and did not seek medical care at that time. States started bleeding 3 days ago and is having severe abd pain. Pt is clearly in pain, but NAD.

## 2016-03-29 NOTE — ED Notes (Signed)
Pelvic exam by  Herbert SetaHeather PA with [facility] EMT at bedside pt tolerated well specimens sent to lab.

## 2016-03-29 NOTE — ED Notes (Signed)
Provider at bedside to assess patient.

## 2016-03-29 NOTE — ED Notes (Signed)
Patient reports having known pregnancy (due to home test) in early June. Patient reports a miscarriage at home with vaginal bleeding that occurred from June 12th to July 2nd. Patient reports no bleeding on July 3rd and 4th and bleeding began again on July 5th. Patient reports heaviest bleeding today with filling one super plus tampon an hour for the last 5-6 hours. Patient reports she is having lower abdominal cramping as well.

## 2016-03-30 ENCOUNTER — Inpatient Hospital Stay (EMERGENCY_DEPARTMENT_HOSPITAL)
Admission: AD | Admit: 2016-03-30 | Discharge: 2016-03-30 | Disposition: A | Discharge status: Home / Self Care | Payer: BLUE CROSS/BLUE SHIELD | Source: Ambulatory Visit | Attending: Obstetrics & Gynecology | Admitting: Obstetrics & Gynecology

## 2016-03-30 ENCOUNTER — Encounter (HOSPITAL_COMMUNITY): Payer: Self-pay

## 2016-03-30 DIAGNOSIS — O034 Incomplete spontaneous abortion without complication: Secondary | ICD-10-CM | POA: Diagnosis not present

## 2016-03-30 DIAGNOSIS — N939 Abnormal uterine and vaginal bleeding, unspecified: Secondary | ICD-10-CM

## 2016-03-30 DIAGNOSIS — O26899 Other specified pregnancy related conditions, unspecified trimester: Secondary | ICD-10-CM | POA: Insufficient documentation

## 2016-03-30 LAB — CBC
HCT: 30.4 % — ABNORMAL LOW (ref 36.0–46.0)
Hemoglobin: 9.6 g/dL — ABNORMAL LOW (ref 12.0–15.0)
MCH: 22.2 pg — AB (ref 26.0–34.0)
MCHC: 31.6 g/dL (ref 30.0–36.0)
MCV: 70.2 fL — ABNORMAL LOW (ref 78.0–100.0)
PLATELETS: 204 10*3/uL (ref 150–400)
RBC: 4.33 MIL/uL (ref 3.87–5.11)
RDW: 17.2 % — AB (ref 11.5–15.5)
WBC: 7.7 10*3/uL (ref 4.0–10.5)

## 2016-03-30 LAB — ABO/RH: ABO/RH(D): B POS

## 2016-03-30 LAB — HCG, QUANTITATIVE, PREGNANCY: hCG, Beta Chain, Quant, S: 2301 m[IU]/mL — ABNORMAL HIGH (ref <5)

## 2016-03-30 MED ORDER — OXYCODONE-ACETAMINOPHEN 5-325 MG PO TABS: 1.0000 | ORAL_TABLET | Freq: Four times a day (QID) | ORAL | Status: DC | PRN

## 2016-03-30 MED ORDER — HYDROMORPHONE HCL 1 MG/ML IJ SOLN
1.0000 mg | Freq: Once | INTRAMUSCULAR | Status: AC
  Administered 2016-03-30: 1 mg via INTRAMUSCULAR
  Filled 2016-03-30: qty 1

## 2016-03-30 NOTE — Discharge Instructions (Signed)
Return to the Emergency Department if you become lightheaded or pass out, have significant pelvic pain, or bleeding worsens.  If you are having to change a pad/tampon that is saturated with blood every 1-2 hours then [facility].

## 2016-03-30 NOTE — MAU Provider Note (Signed)
History   Chief Complaint:  Vaginal Bleeding   Sarah Rios is  30 y.o. G2P0 Patient's last menstrual period was 12/07/2015.Marland Kitchen Patient is here for follow up of quantitative HCG and ongoing surveillance of pregnancy status.   She is Unknown weeks gestation  by LMP.    RN Note: Pt states that she was seen at [facility] yesterday for pain and vag bleeding-had miscarriage 3 weeks ago. States tonight that the pain came back and is still having some vag bleeding. States that she has changed tampon x4-not as heavy as yesterday. Pain is much worse and in lower abdomen and back-dull, achy, intermittent. Rates 6/10        ED MD note: Patient reports that she passed tissue per vagina 03/03/2016 and she thinks that she had a miscarriage then. She developed vaginal bleeding and abdominal cramping again on 03/26/2016. She states bleeding is cramping has improved much since arrival here. She appears comfortable and in no distress. Patient with elevated quantitative hCG and no intrauterine pregnancy. Suspect miscarriage. Ectopic pregnancy less likely. She reports her blood type is B+   Since her last visit, the patient is without new complaint.   The patient reports bleeding as  bright red and similar to period.   Was seen by ED doctor, but came here because some cramping came back and she was concerned that she still has bleeding this far past what she thought was a miscarriage.  General ROS:  positive for vaginal bleeding and pelvic cramping  Her previous Quantitative HCG values are:   Ref. Range 03/29/2016 21:49  HCG, Beta Chain, Quant, S Latest Ref Range: <5 mIU/mL 8875 (H)       Physical Exam   Blood pressure 146/108, pulse 80, temperature 97.7 F (36.5 C), temperature source Oral, resp. rate 16, height  (1.575 m), weight 188 lb (85.276 kg), last menstrual period 12/07/2015, SpO2 100 %.  Focused Gynecological Exam: normal external genitalia, vulva, vagina, cervix, uterus and  adnexa, Bleeding small, no hemorrhage, no clots, abdomen soft and nontender.  Heart rate RRR, lungs clear.  Labs:   Ref. Range 03/30/2016 19:32  HCG, Beta Chain, Quant, S Latest Ref Range: <5 mIU/mL 2301 (H)   Results for orders placed or performed during the hospital encounter of 03/29/16 (from the past 24 hour(s))  CBC with Differential/Platelet   Collection Time: 03/29/16  9:49 PM  Result Value Ref Range   WBC 9.6 4.0 - 10.5 K/uL   RBC 4.62 3.87 - 5.11 MIL/uL   Hemoglobin 10.6 (L) 12.0 - 15.0 g/dL   HCT 69.6 (L) 29.5 - 28.4 %   MCV 70.6 (L) 78.0 - 100.0 fL   MCH 22.9 (L) 26.0 - 34.0 pg   MCHC 32.5 30.0 - 36.0 g/dL   RDW 13.2 (H) 44.0 - 10.2 %   Platelets 236 150 - 400 K/uL   Neutrophils Relative % 82 %   Neutro Abs 7.9 (H) 1.7 - 7.7 K/uL   Lymphocytes Relative 11 %   Lymphs Abs 1.0 0.7 - 4.0 K/uL   Monocytes Relative 7 %   Monocytes Absolute 0.7 0.1 - 1.0 K/uL   Eosinophils Relative 0 %   Eosinophils Absolute 0.0 0.0 - 0.7 K/uL   Basophils Relative 0 %   Basophils Absolute 0.0 0.0 - 0.1 K/uL  Basic metabolic panel   Collection Time: 03/29/16  9:49 PM  Result Value Ref Range   Sodium 137 135 - 145 mmol/L   Potassium 3.3 (  L) 3.5 - 5.1 mmol/L   Chloride 105 101 - 111 mmol/L   CO2 23 22 - 32 mmol/L   Glucose, Bld 97 65 - 99 mg/dL   BUN 8 6 - 20 mg/dL   Creatinine, Ser 1.61 0.44 - 1.00 mg/dL   Calcium 9.2 8.9 - 09.6 mg/dL   GFR calc non Af Amer >60 >60 mL/min   GFR calc Af Amer >60 >60 mL/min   Anion gap 9 5 - 15  hCG, quantitative, pregnancy   Collection Time: 03/29/16  9:49 PM  Result Value Ref Range   hCG, Beta Chain, Quant, S 8875 (H) <5 mIU/mL  ABO/Rh   Collection Time: 03/29/16  9:49 PM  Result Value Ref Range   ABO/RH(D) B POS    No rh immune globuloin      NOT A RH IMMUNE GLOBULIN CANDIDATE, PT RH POSITIVE Performed at [facility]   Urinalysis, Routine w reflex microscopic   Collection Time: 03/29/16 11:09 PM  Result Value Ref Range   Color,  Urine AMBER (A) YELLOW   APPearance CLOUDY (A) CLEAR   Specific Gravity, Urine 1.027 1.005 - 1.030   pH 5.5 5.0 - 8.0   Glucose, UA NEGATIVE NEGATIVE mg/dL   Hgb urine dipstick LARGE (A) NEGATIVE   Bilirubin Urine NEGATIVE NEGATIVE   Ketones, ur 15 (A) NEGATIVE mg/dL   Protein, ur NEGATIVE NEGATIVE mg/dL   Nitrite NEGATIVE NEGATIVE   Leukocytes, UA NEGATIVE NEGATIVE  Pregnancy, urine   Collection Time: 03/29/16 11:09 PM  Result Value Ref Range   Preg Test, Ur POSITIVE (A) NEGATIVE  Urine microscopic-add on   Collection Time: 03/29/16 11:09 PM  Result Value Ref Range   Squamous Epithelial / LPF 0-5 (A) NONE SEEN   WBC, UA 0-5 0 - 5 WBC/hpf   RBC / HPF TOO NUMEROUS TO COUNT 0 - 5 RBC/hpf   Bacteria, UA RARE (A) NONE SEEN   Crystals CA OXALATE CRYSTALS (A) NEGATIVE   Urine-Other MUCOUS PRESENT   Wet prep, genital   Collection Time: 03/29/16 11:15 PM  Result Value Ref Range   Yeast Wet Prep HPF POC NONE SEEN NONE SEEN   Trich, Wet Prep NONE SEEN NONE SEEN   Clue Cells Wet Prep HPF POC NONE SEEN NONE SEEN   WBC, Wet Prep HPF POC NONE SEEN NONE SEEN   Sperm NONE SEEN     Ultrasound Studies:   US Ob Comp Less 14 Wks  03/29/2016  CLINICAL DATA:  Acute onset of vaginal bleeding.  Initial encounter. EXAM: OBSTETRIC <14 WK [place] AND TRANSVAGINAL OB US TECHNIQUE: Both transabdominal and transvaginal ultrasound examinations were performed for complete evaluation of the gestation as well as the maternal uterus, adnexal regions, and pelvic cul-de-sac. Transvaginal technique was performed to assess early pregnancy. COMPARISON:  None. FINDINGS: Intrauterine gestational sac: None seen. Yolk sac:  N/A Embryo:  N/A Subchorionic hemorrhage:  None visualized. Maternal uterus/adnexae: Heterogeneous material is noted within the endometrial echo complex. The echo complex measures up to 2.3 cm. This could reflect clot. The adnexa are unremarkable in appearance. The right ovary measures 4.0 x 2.0 x 2.8 cm,  while the left ovary measures 4.5 x 1.9 x 2.1 cm. No suspicious adnexal masses are seen; there is no evidence for ovarian torsion. Trace free fluid is seen within the pelvic cul-de-sac. IMPRESSION: No intrauterine gestational sac seen. No evidence for ectopic pregnancy at this time. Would follow-up quantitative HCG level. If it remains below 1,000, this would  remain within normal limits, and follow-up pelvic ultrasound would be indicated if quantitative beta HCG level continues to rise. Otherwise, this would reflect spontaneous abortion, given apparent clot within the endometrial echo complex. Electronically Signed   By: Roanna RaiderJeffery  Chang M.D.   On: 03/29/2016 22:27   [place] Ob Transvaginal  03/29/2016  CLINICAL DATA:  Acute onset of vaginal bleeding.  Initial encounter. EXAM: OBSTETRIC <14 WK US AND TRANSVAGINAL OB US TECHNIQUE: Both transabdominal and transvaginal ultrasound examinations were performed for complete evaluation of the gestation as well as the maternal uterus, adnexal regions, and pelvic cul-de-sac. Transvaginal technique was performed to assess early pregnancy. COMPARISON:  None. FINDINGS: Intrauterine gestational sac: None seen. Yolk sac:  N/A Embryo:  N/A Subchorionic hemorrhage:  None visualized. Maternal uterus/adnexae: Heterogeneous material is noted within the endometrial echo complex. The echo complex measures up to 2.3 cm. This could reflect clot. The adnexa are unremarkable in appearance. The right ovary measures 4.0 x 2.0 x 2.8 cm, while the left ovary measures 4.5 x 1.9 x 2.1 cm. No suspicious adnexal masses are seen; there is no evidence for ovarian torsion. Trace free fluid is seen within the pelvic cul-de-sac. IMPRESSION: No intrauterine gestational sac seen. No evidence for ectopic pregnancy at this time. Would follow-up quantitative HCG level. If it remains below 1,000, this would remain within normal limits, and follow-up pelvic ultrasound would be indicated if quantitative beta HCG  level continues to rise. Otherwise, this would reflect spontaneous abortion, given apparent clot within the endometrial echo complex. Electronically Signed   By: Roanna RaiderJeffery  Chang M.D.   On: 03/29/2016 22:27    Assessment:  Unknown weeks gestation  With Quant HCG level dropped from 8875 to 2301 in one day. Probable completed spontaneous abortion   Plan: The patient is instructed to follow up in in 7 days for repeat quant with weekly quants until 0. Encouraged to return here or to other Urgent Care/ED if she develops worsening of symptoms, increase in pain, fever, or other concerning symptoms.    Sarah Bourgeois.  WILLIAMS,MARIE 03/30/2016, 7:36 PM

## 2016-03-30 NOTE — ED Provider Notes (Signed)
Patient reports that she passed tissue per vagina 03/03/2016 and she thinks that she had a miscarriage then. She developed vaginal bleeding and abdominal cramping again on 03/26/2016. She states bleeding is cramping has improved much since arrival here. She appears comfortable and in no distress. Patient with elevated quantitative hCG and no intrauterine pregnancy. Suspect miscarriage. Ectopic pregnancy less likely. She reports her blood type is B+  Doug SouSam Milarose Savich, MD 03/30/16 [phone]

## 2016-03-30 NOTE — Discharge Instructions (Signed)
Incomplete Miscarriage A miscarriage is the sudden loss of an unborn baby (fetus) before the 20th week of pregnancy. In an incomplete miscarriage, parts of the fetus or placenta (afterbirth) remain in the body.  Having a miscarriage can be an emotional experience. Talk with your health care provider about any questions you may have about miscarrying, the grieving process, and your future pregnancy plans. CAUSES   Problems with the fetal chromosomes that make it impossible for the baby to develop normally. Problems with the baby's genes or chromosomes are most often the result of errors that occur by chance as the embryo divides and grows. The problems are not inherited from the parents.  Infection of the cervix or uterus.  Hormone problems.  Problems with the cervix, such as having an incompetent cervix. This is when the tissue in the cervix is not strong enough to hold the pregnancy.  Problems with the uterus, such as an abnormally shaped uterus, uterine fibroids, or congenital abnormalities.  Certain medical conditions.  Smoking, drinking alcohol, or taking illegal drugs.  Trauma. SYMPTOMS   Vaginal bleeding or spotting, with or without cramps or pain.  Pain or cramping in the abdomen or lower back.  Passing fluid, tissue, or blood clots from the vagina. DIAGNOSIS  Your health care provider will perform a physical exam. You may also have an ultrasound to confirm the miscarriage. Blood or urine tests may also be ordered. TREATMENT   Usually, a dilation and curettage (D&C) procedure is performed. During a D&C procedure, the cervix is widened (dilated) and any remaining fetal or placental tissue is gently removed from the uterus.  Antibiotic medicines are prescribed if there is an infection. Other medicines may be given to reduce the size of the uterus (contract) if there is a lot of bleeding.  If you have Rh negative blood and your baby was Rh positive, you will need a Rho (D)  immune globulin shot. This shot will protect any future baby from having Rh blood problems in future pregnancies.  You may be confined to bed rest. This means you should stay in bed and only get up to use the bathroom. HOME CARE INSTRUCTIONS   Rest as directed by your health care provider.  Restrict activity as directed by your health care provider. You may be allowed to continue light activity if curettage was not done but you require further treatment.  Keep track of the number of pads you use each day. Keep track of how soaked (saturated) they are. Record this information.  Do not  use tampons.  Do not douche or have sexual intercourse until approved by your health care provider.  Keep all follow-up appointments for reevaluation and continuing management.  Only take over-the-counter or prescription medicines for pain, fever, or discomfort as directed by your health care provider.  Take antibiotic medicine as directed by your health care provider. Make sure you finish it even if you start to feel better. SEEK IMMEDIATE MEDICAL CARE IF:   You experience severe cramps in your stomach, back, or abdomen.  You have an unexplained temperature (make sure to record these temperatures).  You pass large clots or tissue (save these for your health care provider to inspect).  Your bleeding increases.  You become light-headed, weak, or have fainting episodes. MAKE SURE YOU:   Understand these instructions.  Will watch your condition.  Will get help right away if you are not doing well or get worse.   This information is not intended to   replace advice given to you by your health care provider. Make sure you discuss any questions you have with your health care provider.   Document Released: 09/08/2005 Document Revised: 09/29/2014 Document Reviewed: 04/07/2013 Elsevier Interactive Patient Education 2016 Elsevier Inc.  

## 2016-03-30 NOTE — MAU Note (Signed)
Pt states that she was seen at [facility] yesterday for pain and vag bleeding-had miscarriage 3 weeks ago. States tonight that the pain came back and is still having some vag bleeding. States that she has changed tampon x4-not as heavy as yesterday. Pain is much worse and in lower abdomen and back-dull, achy, intermittent. Rates 6/10

## 2016-03-31 LAB — GC/CHLAMYDIA PROBE AMP (~~LOC~~) NOT AT ARMC
Chlamydia: NEGATIVE
NEISSERIA GONORRHEA: NEGATIVE

## 2016-04-01 ENCOUNTER — Other Ambulatory Visit: Payer: BLUE CROSS/BLUE SHIELD

## 2016-04-07 ENCOUNTER — Encounter: Payer: Self-pay | Admitting: Obstetrics and Gynecology

## 2016-04-14 ENCOUNTER — Other Ambulatory Visit: Payer: BLUE CROSS/BLUE SHIELD

## 2016-04-14 DIAGNOSIS — O034 Incomplete spontaneous abortion without complication: Secondary | ICD-10-CM

## 2016-04-14 LAB — HCG, QUANTITATIVE, PREGNANCY: hCG, Beta Chain, Quant, S: 22.9 m[IU]/mL — ABNORMAL HIGH

## 2016-05-28 ENCOUNTER — Emergency Department (HOSPITAL_BASED_OUTPATIENT_CLINIC_OR_DEPARTMENT_OTHER)
Admission: EM | Admit: 2016-05-28 | Discharge: 2016-05-28 | Disposition: A | Discharge status: Home / Self Care | Payer: BLUE CROSS/BLUE SHIELD | Attending: Emergency Medicine | Admitting: Emergency Medicine

## 2016-05-28 ENCOUNTER — Encounter (HOSPITAL_BASED_OUTPATIENT_CLINIC_OR_DEPARTMENT_OTHER): Payer: Self-pay | Admitting: Emergency Medicine

## 2016-05-28 DIAGNOSIS — Z79899 Other long term (current) drug therapy: Secondary | ICD-10-CM | POA: Insufficient documentation

## 2016-05-28 DIAGNOSIS — J029 Acute pharyngitis, unspecified: Secondary | ICD-10-CM | POA: Insufficient documentation

## 2016-05-28 DIAGNOSIS — I10 Essential (primary) hypertension: Secondary | ICD-10-CM | POA: Diagnosis not present

## 2016-05-28 DIAGNOSIS — J020 Streptococcal pharyngitis: Secondary | ICD-10-CM

## 2016-05-28 LAB — RAPID STREP SCREEN (MED CTR MEBANE ONLY): Streptococcus, Group A Screen (Direct): POSITIVE — AB

## 2016-05-28 MED ORDER — ACETAMINOPHEN 325 MG PO TABS
650.0000 mg | ORAL_TABLET | Freq: Once | ORAL | Status: AC
  Administered 2016-05-28: 650 mg via ORAL
  Filled 2016-05-28: qty 2

## 2016-05-28 MED ORDER — PENICILLIN G BENZATHINE 1200000 UNIT/2ML IM SUSP
1.2000 10*6.[IU] | Freq: Once | INTRAMUSCULAR | Status: AC
Start: 2016-05-28 — End: 2016-05-28
  Administered 2016-05-28: 1.2 10*6.[IU] via INTRAMUSCULAR
  Filled 2016-05-28: qty 2

## 2016-05-28 NOTE — ED Notes (Signed)
MD at bedside. 

## 2016-05-28 NOTE — ED Provider Notes (Signed)
[facility] Provider Note   CSN: [id number] Arrival date & time: 05/28/16  1248     History   Chief Complaint Chief Complaint  Patient presents with  . Sore Throat  . Otalgia    HPI Sarah Rios is a 30 y.o. female.  Sore throat for the past 2 days.   The history is provided by the patient.  Sore Throat  This is a new problem. The current episode started 2 days ago. The problem occurs constantly. The problem has been rapidly worsening. Pertinent negatives include no chest pain and no shortness of breath. The symptoms are aggravated by swallowing. Nothing relieves the symptoms. She has tried nothing for the symptoms.  Otalgia     Past Medical History:  Diagnosis Date  . Hypertension     There are no active problems to display for this patient.   Past Surgical History:  Procedure Laterality Date  . CESAREAN SECTION      OB History    Gravida Para Term Preterm AB Living   5 2 2   2 2    SAB TAB Ectopic Multiple Live Births     2             Home Medications    Prior to Admission medications   Medication Sig Start Date End Date Taking? Authorizing Provider  metoprolol (LOPRESSOR) 50 MG tablet Take 1 tablet (50 mg total) by mouth daily. 12/28/15  Yes Marily MemosJason Mesner, MD  cyclobenzaprine (FLEXERIL) 10 MG tablet Take 1 tablet (10 mg total) by mouth 3 (three) times daily as needed for muscle spasms. 04/22/13   Ivonne AndrewPeter Dammen, PA-C  naproxen (NAPROSYN) 500 MG tablet Take 1 tablet (500 mg total) by mouth 2 (two) times daily. 04/22/13   Ivonne AndrewPeter Dammen, PA-C  oxyCODONE-acetaminophen (PERCOCET/ROXICET) 5-325 MG tablet Take 1-2 tablets by mouth every 6 (six) hours as needed. 03/30/16   Aviva SignsMarie L Williams, CNM    Family History Family History  Problem Relation Age of Onset  . Adopted: Yes    Social History Social History  Substance Use Topics  . Smoking status: Never Smoker  . Smokeless tobacco: Never Used  . Alcohol use Yes     Comment: occa     Allergies    Review of patient's allergies indicates no known allergies.   Review of Systems Review of Systems  HENT: Positive for ear pain.   Respiratory: Negative for shortness of breath.   Cardiovascular: Negative for chest pain.  All other systems reviewed and are negative.    Physical Exam Updated Vital Signs BP (!) 153/112 (BP Location: Left Arm)   Pulse 84   Temp 99.8 F (37.7 C) (Oral)   Resp 18   Ht 5\' 1"  (1.549 m)   Wt 185 lb (83.9 kg)   LMP 05/27/2016 Comment: hasn't had normal cycle since May  SpO2 100%   BMI 34.96 kg/m   Physical Exam  Constitutional: She is oriented to person, place, and time. She appears well-developed and well-nourished. No distress.  HENT:  Head: Normocephalic and atraumatic.  By mouth is erythematous with exudates.  Neck: Normal range of motion. Neck supple.  Cardiovascular: Normal rate and regular rhythm.  Exam reveals no gallop and no friction rub.   No murmur heard. Pulmonary/Chest: Effort normal and breath sounds normal. No stridor. No respiratory distress. She has no wheezes.  Abdominal: Soft. Bowel sounds are normal. She exhibits no distension. There is no tenderness.  Musculoskeletal: Normal range of motion.  Lymphadenopathy:    She has cervical adenopathy.  Neurological: She is alert and oriented to person, place, and time.  Skin: Skin is warm and dry. She is not diaphoretic.  Nursing note and vitals reviewed.    ED Treatments / Results  Labs (all labs ordered are listed, but only abnormal results are displayed) Labs Reviewed  RAPID STREP SCREEN (NOT AT [facility]) - Abnormal; Notable for the following:       Result Value   Streptococcus, Group A Screen (Direct) POSITIVE (*)    All other components within normal limits    EKG  EKG Interpretation None       Radiology No results found.  Procedures Procedures (including critical care time)  Medications Ordered in ED Medications  penicillin g benzathine (BICILLIN LA) 1200000  UNIT/2ML injection 1.2 Million Units (not administered)     Initial Impression / Assessment and Plan / ED Course  I have reviewed the triage vital signs and the nursing notes.  Pertinent labs & imaging results that were available during my care of the patient were reviewed by me and considered in my medical decision making (see chart for details).  Clinical Course    Strep test is positive. We'll treat with Bicillin. She is to return as needed for any problems.  Final Clinical Impressions(s) / ED Diagnoses   Final diagnoses:  None    New Prescriptions New Prescriptions   No medications on file     Geoffery Lyons, MD 05/28/16 1336

## 2016-05-28 NOTE — Discharge Instructions (Signed)
Ibuprofen 600 mg every 6 hours as needed for pain.  Return to the emergency department for difficulty breathing or swallowing, or other new and concerning symptoms.

## 2016-05-28 NOTE — ED Notes (Signed)
Note for work given 

## 2016-05-28 NOTE — ED Triage Notes (Signed)
Pt with sore throat and left ear pain since Friday. No fevers reported.

## 2018-09-24 ENCOUNTER — Encounter: Payer: Self-pay | Admitting: Obstetrics & Gynecology

## 2018-09-24 ENCOUNTER — Encounter (HOSPITAL_COMMUNITY): Payer: Self-pay

## 2018-09-24 ENCOUNTER — Inpatient Hospital Stay (HOSPITAL_COMMUNITY): Payer: 59 | Admitting: Anesthesiology

## 2018-09-24 ENCOUNTER — Inpatient Hospital Stay (HOSPITAL_COMMUNITY): Payer: 59

## 2018-09-24 ENCOUNTER — Inpatient Hospital Stay (HOSPITAL_COMMUNITY)
Admission: AD | Admit: 2018-09-24 | Discharge: 2018-09-24 | Disposition: A | Discharge status: Home / Self Care | Payer: 59 | Attending: Obstetrics & Gynecology | Admitting: Obstetrics & Gynecology

## 2018-09-24 ENCOUNTER — Other Ambulatory Visit: Payer: Self-pay

## 2018-09-24 ENCOUNTER — Encounter (HOSPITAL_COMMUNITY)
Admission: AD | Disposition: A | Discharge status: Home / Self Care | Payer: Self-pay | Source: Home / Self Care | Attending: Obstetrics & Gynecology

## 2018-09-24 DIAGNOSIS — O26891 Other specified pregnancy related conditions, first trimester: Secondary | ICD-10-CM | POA: Diagnosis not present

## 2018-09-24 DIAGNOSIS — I10 Essential (primary) hypertension: Secondary | ICD-10-CM | POA: Insufficient documentation

## 2018-09-24 DIAGNOSIS — O00101 Right tubal pregnancy without intrauterine pregnancy: Secondary | ICD-10-CM | POA: Insufficient documentation

## 2018-09-24 DIAGNOSIS — R109 Unspecified abdominal pain: Secondary | ICD-10-CM

## 2018-09-24 DIAGNOSIS — B9689 Other specified bacterial agents as the cause of diseases classified elsewhere: Secondary | ICD-10-CM | POA: Diagnosis present

## 2018-09-24 DIAGNOSIS — D649 Anemia, unspecified: Secondary | ICD-10-CM | POA: Diagnosis not present

## 2018-09-24 DIAGNOSIS — O008 Other ectopic pregnancy without intrauterine pregnancy: Secondary | ICD-10-CM

## 2018-09-24 DIAGNOSIS — N760 Acute vaginitis: Secondary | ICD-10-CM

## 2018-09-24 DIAGNOSIS — R1030 Lower abdominal pain, unspecified: Secondary | ICD-10-CM | POA: Diagnosis present

## 2018-09-24 DIAGNOSIS — O26899 Other specified pregnancy related conditions, unspecified trimester: Secondary | ICD-10-CM

## 2018-09-24 DIAGNOSIS — K661 Hemoperitoneum: Secondary | ICD-10-CM

## 2018-09-24 DIAGNOSIS — O209 Hemorrhage in early pregnancy, unspecified: Secondary | ICD-10-CM

## 2018-09-24 HISTORY — DX: Anemia, unspecified: D64.9

## 2018-09-24 HISTORY — PX: LAPAROSCOPY: SHX197

## 2018-09-24 LAB — CBC
HEMATOCRIT: 30.2 % — AB (ref 36.0–46.0)
HEMOGLOBIN: 9.4 g/dL — AB (ref 12.0–15.0)
MCH: 23.4 pg — AB (ref 26.0–34.0)
MCHC: 31.1 g/dL (ref 30.0–36.0)
MCV: 75.1 fL — ABNORMAL LOW (ref 80.0–100.0)
Platelets: 217 10*3/uL (ref 150–400)
RBC: 4.02 MIL/uL (ref 3.87–5.11)
RDW: 17.1 % — ABNORMAL HIGH (ref 11.5–15.5)
WBC: 7.6 10*3/uL (ref 4.0–10.5)
nRBC: 0 % (ref 0.0–0.2)

## 2018-09-24 LAB — URINALYSIS, ROUTINE W REFLEX MICROSCOPIC
BILIRUBIN URINE: NEGATIVE
Glucose, UA: NEGATIVE mg/dL
HGB URINE DIPSTICK: NEGATIVE
Ketones, ur: NEGATIVE mg/dL
LEUKOCYTES UA: NEGATIVE
NITRITE: NEGATIVE
PH: 6 (ref 5.0–8.0)
Protein, ur: 30 mg/dL — AB
SPECIFIC GRAVITY, URINE: 1.025 (ref 1.005–1.030)

## 2018-09-24 LAB — WET PREP, GENITAL
SPERM: NONE SEEN
Trich, Wet Prep: NONE SEEN
Yeast Wet Prep HPF POC: NONE SEEN

## 2018-09-24 LAB — TYPE AND SCREEN
ABO/RH(D): B POS
Antibody Screen: NEGATIVE

## 2018-09-24 LAB — ABO/RH: ABO/RH(D): B POS

## 2018-09-24 LAB — HCG, QUANTITATIVE, PREGNANCY: hCG, Beta Chain, Quant, S: 74765 m[IU]/mL — ABNORMAL HIGH (ref <5)

## 2018-09-24 LAB — POCT PREGNANCY, URINE: Preg Test, Ur: POSITIVE — AB

## 2018-09-24 SURGERY — LAPAROSCOPY OPERATIVE
Anesthesia: General | Laterality: Right

## 2018-09-24 MED ORDER — OXYCODONE HCL 5 MG PO TABS
ORAL_TABLET | ORAL | Status: AC
  Filled 2018-09-24: qty 1

## 2018-09-24 MED ORDER — MIDAZOLAM HCL 2 MG/2ML IJ SOLN
INTRAMUSCULAR | Status: AC
  Filled 2018-09-24: qty 2

## 2018-09-24 MED ORDER — OXYCODONE HCL 5 MG/5ML PO SOLN: 5.0000 mg | Freq: Once | ORAL | Status: AC | PRN

## 2018-09-24 MED ORDER — OXYCODONE-ACETAMINOPHEN 5-325 MG PO TABS: 1.0000 | ORAL_TABLET | ORAL | 0 refills | Status: DC | PRN

## 2018-09-24 MED ORDER — DOCUSATE SODIUM 100 MG PO CAPS: 100.0000 mg | ORAL_CAPSULE | Freq: Two times a day (BID) | ORAL | 2 refills | Status: AC | PRN

## 2018-09-24 MED ORDER — SUGAMMADEX SODIUM 200 MG/2ML IV SOLN
INTRAVENOUS | Status: DC | PRN
  Administered 2018-09-24: 200 mg via INTRAVENOUS

## 2018-09-24 MED ORDER — ONDANSETRON HCL 4 MG/2ML IJ SOLN
INTRAMUSCULAR | Status: AC
  Filled 2018-09-24: qty 2

## 2018-09-24 MED ORDER — KETOROLAC TROMETHAMINE 30 MG/ML IJ SOLN
INTRAMUSCULAR | Status: DC | PRN
  Administered 2018-09-24: 30 mg via INTRAVENOUS

## 2018-09-24 MED ORDER — PHENYLEPHRINE 40 MCG/ML (10ML) SYRINGE FOR IV PUSH (FOR BLOOD PRESSURE SUPPORT)
PREFILLED_SYRINGE | INTRAVENOUS | Status: AC
  Filled 2018-09-24: qty 10

## 2018-09-24 MED ORDER — ONDANSETRON HCL 4 MG/2ML IJ SOLN
INTRAMUSCULAR | Status: DC | PRN
  Administered 2018-09-24: 4 mg via INTRAVENOUS

## 2018-09-24 MED ORDER — BUPIVACAINE HCL (PF) 0.5 % IJ SOLN
INTRAMUSCULAR | Status: DC | PRN
  Administered 2018-09-24: 10 mL
  Administered 2018-09-24: 12 mL

## 2018-09-24 MED ORDER — KETOROLAC TROMETHAMINE 30 MG/ML IJ SOLN
INTRAMUSCULAR | Status: AC
  Filled 2018-09-24: qty 1

## 2018-09-24 MED ORDER — IBUPROFEN 600 MG PO TABS: 600.0000 mg | ORAL_TABLET | Freq: Four times a day (QID) | ORAL | 2 refills | Status: DC | PRN

## 2018-09-24 MED ORDER — SUCCINYLCHOLINE CHLORIDE 200 MG/10ML IV SOSY
PREFILLED_SYRINGE | INTRAVENOUS | Status: AC
  Filled 2018-09-24: qty 10

## 2018-09-24 MED ORDER — FENTANYL CITRATE (PF) 100 MCG/2ML IJ SOLN
INTRAMUSCULAR | Status: DC | PRN
  Administered 2018-09-24: 100 ug via INTRAVENOUS

## 2018-09-24 MED ORDER — MEPERIDINE HCL 25 MG/ML IJ SOLN: 6.2500 mg | INTRAMUSCULAR | Status: DC | PRN

## 2018-09-24 MED ORDER — FENTANYL CITRATE (PF) 250 MCG/5ML IJ SOLN
INTRAMUSCULAR | Status: AC
  Filled 2018-09-24: qty 5

## 2018-09-24 MED ORDER — OXYCODONE HCL 5 MG PO TABS
5.0000 mg | ORAL_TABLET | Freq: Once | ORAL | Status: AC | PRN
  Administered 2018-09-24: 5 mg via ORAL

## 2018-09-24 MED ORDER — DEXAMETHASONE SODIUM PHOSPHATE 10 MG/ML IJ SOLN
INTRAMUSCULAR | Status: DC | PRN
  Administered 2018-09-24: 10 mg via INTRAVENOUS

## 2018-09-24 MED ORDER — PROMETHAZINE HCL 25 MG/ML IJ SOLN: 6.2500 mg | INTRAMUSCULAR | Status: DC | PRN

## 2018-09-24 MED ORDER — PROPOFOL 10 MG/ML IV BOLUS
INTRAVENOUS | Status: DC | PRN
  Administered 2018-09-24: 170 mg via INTRAVENOUS
  Administered 2018-09-24: 30 mg via INTRAVENOUS

## 2018-09-24 MED ORDER — HYDROMORPHONE HCL 1 MG/ML IJ SOLN: 0.2500 mg | INTRAMUSCULAR | Status: DC | PRN

## 2018-09-24 MED ORDER — ROCURONIUM BROMIDE 100 MG/10ML IV SOLN
INTRAVENOUS | Status: AC
  Filled 2018-09-24: qty 1

## 2018-09-24 MED ORDER — ROCURONIUM BROMIDE 100 MG/10ML IV SOLN
INTRAVENOUS | Status: DC | PRN
  Administered 2018-09-24: 20 mg via INTRAVENOUS
  Administered 2018-09-24: 10 mg via INTRAVENOUS

## 2018-09-24 MED ORDER — LACTATED RINGERS IV BOLUS
1000.0000 mL | Freq: Once | INTRAVENOUS | Status: AC
  Administered 2018-09-24: 1000 mL via INTRAVENOUS

## 2018-09-24 MED ORDER — LIDOCAINE HCL (CARDIAC) PF 100 MG/5ML IV SOSY
PREFILLED_SYRINGE | INTRAVENOUS | Status: AC
  Filled 2018-09-24: qty 5

## 2018-09-24 MED ORDER — DEXAMETHASONE SODIUM PHOSPHATE 10 MG/ML IJ SOLN
INTRAMUSCULAR | Status: AC
  Filled 2018-09-24: qty 1

## 2018-09-24 MED ORDER — LIDOCAINE HCL (CARDIAC) PF 100 MG/5ML IV SOSY
PREFILLED_SYRINGE | INTRAVENOUS | Status: DC | PRN
  Administered 2018-09-24: 80 mg via INTRAVENOUS

## 2018-09-24 MED ORDER — PROPOFOL 10 MG/ML IV BOLUS
INTRAVENOUS | Status: AC
  Filled 2018-09-24: qty 20

## 2018-09-24 MED ORDER — LACTATED RINGERS IV SOLN
INTRAVENOUS | Status: DC | PRN
  Administered 2018-09-24 (×3): via INTRAVENOUS

## 2018-09-24 MED ORDER — MIDAZOLAM HCL 2 MG/2ML IJ SOLN
INTRAMUSCULAR | Status: DC | PRN
  Administered 2018-09-24: 1 mg via INTRAVENOUS

## 2018-09-24 MED ORDER — SUCCINYLCHOLINE CHLORIDE 20 MG/ML IJ SOLN
INTRAMUSCULAR | Status: DC | PRN
  Administered 2018-09-24: 140 mg via INTRAVENOUS

## 2018-09-24 MED ORDER — SUGAMMADEX SODIUM 200 MG/2ML IV SOLN
INTRAVENOUS | Status: AC
  Filled 2018-09-24: qty 2

## 2018-09-24 SURGICAL SUPPLY — 31 items
ADH SKN CLS APL DERMABOND .7 (GAUZE/BANDAGES/DRESSINGS) ×1
BAG SPEC RTRVL LRG 6X4 10 (ENDOMECHANICALS) ×1
CABLE HIGH FREQUENCY MONO STRZ (ELECTRODE) IMPLANT
DERMABOND ADVANCED (GAUZE/BANDAGES/DRESSINGS) ×1
DERMABOND ADVANCED .7 DNX12 (GAUZE/BANDAGES/DRESSINGS) ×1 IMPLANT
DRSG OPSITE 4X5.5 SM (GAUZE/BANDAGES/DRESSINGS) ×1 IMPLANT
DRSG OPSITE POSTOP 3X4 (GAUZE/BANDAGES/DRESSINGS) IMPLANT
DURAPREP 26ML APPLICATOR (WOUND CARE) ×2 IMPLANT
GLOVE BIOGEL PI IND STRL 7.0 (GLOVE) ×4 IMPLANT
GLOVE BIOGEL PI INDICATOR 7.0 (GLOVE) ×4
GLOVE ECLIPSE 7.0 STRL STRAW (GLOVE) ×2 IMPLANT
GOWN STRL REUS W/TWL LRG LVL3 (GOWN DISPOSABLE) ×6 IMPLANT
HIBICLENS CHG 4% 4OZ BTL (MISCELLANEOUS) ×2 IMPLANT
NEEDLE INSUFFLATION 120MM (ENDOMECHANICALS) IMPLANT
NS IRRIG 1000ML POUR BTL (IV SOLUTION) ×2 IMPLANT
PACK LAPAROSCOPY BASIN (CUSTOM PROCEDURE TRAY) ×2 IMPLANT
PACK TRENDGUARD 450 HYBRID PRO (MISCELLANEOUS) IMPLANT
POUCH SPECIMEN RETRIEVAL 10MM (ENDOMECHANICALS) ×1 IMPLANT
PROTECTOR NERVE ULNAR (MISCELLANEOUS) ×4 IMPLANT
SET IRRIG TUBING LAPAROSCOPIC (IRRIGATION / IRRIGATOR) ×1 IMPLANT
SHEARS HARMONIC ACE PLUS 36CM (ENDOMECHANICALS) ×1 IMPLANT
SLEEVE XCEL OPT CAN 5 100 (ENDOMECHANICALS) ×2 IMPLANT
SUT VICRYL 0 UR6 27IN ABS (SUTURE) ×4 IMPLANT
SUT VICRYL 4-0 PS2 18IN ABS (SUTURE) ×2 IMPLANT
TOWEL OR 17X24 6PK STRL BLUE (TOWEL DISPOSABLE) ×4 IMPLANT
TRAY FOLEY W/BAG SLVR 14FR (SET/KITS/TRAYS/PACK) ×2 IMPLANT
TRENDGUARD 450 HYBRID PRO PACK (MISCELLANEOUS)
TROCAR XCEL NON-BLD 11X100MML (ENDOMECHANICALS) ×2 IMPLANT
TROCAR XCEL NON-BLD 5MMX100MML (ENDOMECHANICALS) ×2 IMPLANT
TUBING INSUF HEATED (TUBING) ×2 IMPLANT
WARMER LAPAROSCOPE (MISCELLANEOUS) ×2 IMPLANT

## 2018-09-24 NOTE — Op Note (Signed)
Sarah Rios PROCEDURE DATE: 09/24/2018  PREOPERATIVE DIAGNOSIS: Ruptured ectopic pregnancy POSTOPERATIVE DIAGNOSIS: Ruptured right fallopian tube ectopic pregnancy PROCEDURE: Laparoscopic right salpingectomy and removal of ectopic pregnancy SURGEON:  Dr. Jaynie CollinsUgonna Anyanwu ANESTHESIOLOGY TEAM: Anesthesiologist: Lowella CurbMiller, Warren Ray, MD CRNA: Algis GreenhouseBurger, Linda A, CRNA  INDICATIONS: 33 y.o. [phone] at 2662w2d here with the preoperative diagnoses as listed above.  Please refer to preoperative notes for more details. Patient was counseled regarding need for laparoscopic salpingectomy. Risks of surgery including bleeding which may require transfusion or reoperation, infection, injury to bowel or other surrounding organs, need for additional procedures including laparotomy and other postoperative/anesthesia complications were explained to patient.  Written informed consent was obtained.  FINDINGS:  Moderate amount of hemoperitoneum estimated to be about 400 ml of blood and clots.  Dilated right fallopian tube containing ectopic gestation. Small normal appearing uterus, normal left fallopian tube, right ovary and left ovary.  ANESTHESIA: General INTRAVENOUS FLUIDS: 2000 ml ESTIMATED BLOOD LOSS: 400 ml URINE OUTPUT: 100 ml SPECIMENS: Right fallopian tube containing ectopic gestation COMPLICATIONS: None immediate  PROCEDURE IN DETAIL:  The patient was taken to the operating room where general anesthesia was administered and was found to be adequate.  She was placed in the dorsal lithotomy position, and was prepped and draped in a sterile manner.  A Foley catheter was inserted into her bladder and attached to constant drainage and a uterine manipulator was then advanced into the uterus .    After an adequate timeout was performed, attention was turned to the abdomen where an umbilical incision was made with the scalpel.  The Optiview 11-mm trocar and sleeve were then advanced without difficulty with the  laparoscope under direct visualization into the abdomen.  The abdomen was then insufflated with carbon dioxide gas and adequate pneumoperitoneum was obtained.  A survey of the patient's pelvis and abdomen revealed the findings above.  Bilateral 5-mm lower quadrant ports were then placed under direct visualization.  The Nezhat suction irrigator was then used to suction the hemoperitoneum and irrigate the pelvis.  Attention was then turned to the right fallopian tube which was grasped and ligated from the underlying mesosalpinx and uterine attachment using the Harmonic instrument.  Good hemostasis was noted.  The specimen was placed in an EndoCatch bag and removed from the abdomen intact.  The abdomen was desufflated, and all instruments were removed.  The fascial incision of the 11-mm site was reapproximated with a 0 Vicryl figure-of-eight stitch; and all skin incisions were closed with 4-0 Monocryl and Dermabond. The patient tolerated the procedure well.  Sponge, lap, and needle counts were correct times three.  The patient was then taken to the recovery room awake, extubated and in stable condition.   The patient will be discharged to home as per PACU criteria.  Routine postoperative instructions given.  She was prescribed Percocet, Ibuprofen and Colace.  She will follow up in the clinic in about 2-3 weeks for postoperative evaluation.   Jaynie CollinsUGONNA  ANYANWU, MD, FACOG Obstetrician & Gynecologist, [facility], [facility]

## 2018-09-24 NOTE — Anesthesia Procedure Notes (Addendum)
Procedure Name: Intubation Date/Time: 09/24/2018 1:59 PM Performed by: Asher Muir, CRNA Pre-anesthesia Checklist: Patient identified, Emergency Drugs available, Suction available and Patient being monitored Patient Re-evaluated:Patient Re-evaluated prior to induction Oxygen Delivery Method: Circle system utilized and Simple face mask Preoxygenation: Pre-oxygenation with 100% oxygen Induction Type: IV induction, Rapid sequence and Cricoid Pressure applied Ventilation: Mask ventilation without difficulty Laryngoscope Size: Mac and 4 (disposable) Grade View: Grade II Tube type: Oral Tube size: 7.0 mm Number of attempts: 1 Airway Equipment and Method: Stylet Placement Confirmation: ETT inserted through vocal cords under direct vision,  positive ETCO2 and breath sounds checked- equal and bilateral Secured at: 20 (right lip) cm Tube secured with: Tape Dental Injury: Teeth and Oropharynx as per pre-operative assessment

## 2018-09-24 NOTE — H&P (Signed)
Preoperative History and Physical Exam    CSN: [id number]   Arrival date and time: 09/24/18 9038    First Provider Initiated Contact with Patient 09/24/18 1008          Chief Complaint  Patient presents with  . Vaginal Bleeding  . Abdominal Pain  . Possible Pregnancy    HPI   [name] is a 33 y.o. year old G67P2022 female at [redacted]w[redacted]d weeks gestation who presents to MAU reporting (+) UPT at [facility] a couple of days before Christmas. She reports having BRB x 2 days, but none today. This is an undesired pregnancy. She was scheduled to have an abortion on 09/21/18, but there was no IUP seen on the U/S. She was then scheduled for a F/U in 2 wks (on 10/11/2018). She reports she started having sharp, lower abdominal pain, vagina spotting, light-headedness and vomiting x 2 episodes. She states the pain is worse with lying down; hurts if lying on either side as well.       Past Medical History:  Diagnosis Date  . Anemia    . Hypertension             Past Surgical History:  Procedure Laterality Date  . CESAREAN SECTION          Family History  Adopted: Yes      Social History         Tobacco Use  . Smoking status: Never Smoker  . Smokeless tobacco: Never Used  Substance Use Topics  . Alcohol use: Yes      Comment: occa  . Drug use: No      Allergies: No Known Allergies          Medications Prior to Admission  Medication Sig Dispense Refill Last Dose  . hydrochlorothiazide (HYDRODIURIL) 25 MG tablet Take by mouth.     09/17/2018  . metoprolol succinate (TOPROL-XL) 100 MG 24 hr tablet Take 100 mg by mouth daily. Take with or immediately following a meal.     09/24/2018 at Unknown time      Review of Systems  Constitutional: Negative.   HENT: Negative.   Eyes: Negative.   Respiratory: Negative.   Cardiovascular: Negative.   Gastrointestinal: Negative.   Endocrine: Negative.   Genitourinary: Positive for pelvic pain.  Musculoskeletal: Negative.   Skin:  Negative.   Allergic/Immunologic: Negative.   Neurological: Negative.   Hematological: Negative.   Psychiatric/Behavioral: Negative.     Physical Exam    Blood pressure 132/80, pulse 79, temperature 98.2 F (36.8 C), temperature source Oral, resp. rate 17, weight 89 kg, last menstrual period 07/28/2018, SpO2 100 %.   Physical Exam  Nursing note and vitals reviewed. Constitutional: She is oriented to person, place, and time. She appears well-developed and well-nourished.  HENT:  Head: Normocephalic and atraumatic.  Eyes: Pupils are equal, round, and reactive to light.  Neck: Normal range of motion.  Cardiovascular: Normal rate, regular rhythm, normal heart sounds and intact distal pulses.  Respiratory: Effort normal and breath sounds normal.  GI: Soft. Bowel sounds are normal.  Genitourinary:    Genitourinary Comments: Uterus: mildly tender, SE: cervix is smooth, pink, no lesions, small amt of tannish vaginal d/c -- WP, GC/CT done, closed/long/firm, no CMT or friability, exquisite bilateral adnexal tenderness    Musculoskeletal: Normal range of motion.  Neurological: She is alert and oriented to person, place, and time. She has normal reflexes.  Skin: Skin is warm and dry.  Psychiatric: She has a normal mood and affect. Her behavior is normal. Judgment and thought content normal.      MAU Course  Procedures   MDM CCUA UPT CBC ABO/Rh HCG Wet Prep GC/CT -- pending HIV -- pending OB < 14 wks [place] with TV Prep for OR -- patient last ate "handful" of frosted mini wheats @ 0845 and drank OJ @ 0400   **TC from Dr. Bradly Chris at 1152 giving verbal report of U/S   *Consult with Dr. Macon Large @ 1156 - notified of patient's complaints, assessments, lab & U/S results, recommended tx plan prep for OR   Lab Results Last 24 Hours        Results for orders placed or performed during the hospital encounter of 09/24/18 (from the past 24 hour(s))  Urinalysis, Routine w reflex microscopic      Status: Abnormal    Collection Time: 09/24/18  9:30 AM  Result Value Ref Range    Color, Urine AMBER (A) YELLOW    APPearance HAZY (A) CLEAR    Specific Gravity, Urine 1.025 1.005 - 1.030    pH 6.0 5.0 - 8.0    Glucose, UA NEGATIVE NEGATIVE mg/dL    Hgb urine dipstick NEGATIVE NEGATIVE    Bilirubin Urine NEGATIVE NEGATIVE    Ketones, ur NEGATIVE NEGATIVE mg/dL    Protein, ur 30 (A) NEGATIVE mg/dL    Nitrite NEGATIVE NEGATIVE    Leukocytes, UA NEGATIVE NEGATIVE    RBC / HPF 0-5 0 - 5 RBC/hpf    WBC, UA 6-10 0 - 5 WBC/hpf    Bacteria, UA RARE (A) NONE SEEN    Squamous Epithelial / LPF 11-20 0 - 5    Mucus PRESENT    Pregnancy, urine POC     Status: Abnormal    Collection Time: 09/24/18  9:46 AM  Result Value Ref Range    Preg Test, Ur POSITIVE (A) NEGATIVE  Wet prep, genital     Status: Abnormal    Collection Time: 09/24/18 10:19 AM  Result Value Ref Range    Yeast Wet Prep HPF POC NONE SEEN NONE SEEN    Trich, Wet Prep NONE SEEN NONE SEEN    Clue Cells Wet Prep HPF POC PRESENT (A) NONE SEEN    WBC, Wet Prep HPF POC FEW (A) NONE SEEN    Sperm NONE SEEN    CBC     Status: Abnormal    Collection Time: 09/24/18 10:43 AM  Result Value Ref Range    WBC 7.6 4.0 - 10.5 K/uL    RBC 4.02 3.87 - 5.11 MIL/uL    Hemoglobin 9.4 (L) 12.0 - 15.0 g/dL    HCT 96.0 (L) 45.4 - 46.0 %    MCV 75.1 (L) 80.0 - 100.0 fL    MCH 23.4 (L) 26.0 - 34.0 pg    MCHC 31.1 30.0 - 36.0 g/dL    RDW 09.8 (H) 11.9 - 15.5 %    Platelets 217 150 - 400 K/uL    nRBC 0.0 0.0 - 0.2 %  ABO/Rh     Status: None (Preliminary result)    Collection Time: 09/24/18 10:43 AM  Result Value Ref Range    ABO/RH(D)          B POS Performed at [facility], [address], [place], [place] [address]    hCG, quantitative, pregnancy     Status: Abnormal    Collection Time: 09/24/18 10:43 AM  Result Value Ref Range  hCG, Beta Chain, Quant, S 74,765 (H) <5 mIU/mL        [place] Ob Less Than 14 Weeks With Ob  Transvaginal   Result Date: 09/24/2018 CLINICAL DATA:  Abdominal bleeding in early pregnancy. Abdominal pain. EXAM: OBSTETRIC <14 WK US AND TRANSVAGINAL OB US TECHNIQUE: Both transabdominal and transvaginal ultrasound examinations were performed for complete evaluation of the gestation as well as the maternal uterus, adnexal regions, and pelvic cul-de-sac. Transvaginal technique was performed to assess early pregnancy. COMPARISON:  03/29/2016 FINDINGS: Intrauterine gestational sac: Not visualized Yolk sac:  Not visualized Embryo:  Not visualize Maternal uterus/adnexae: Right ovary: Corpus luteum identified within the right ovary. Adjacent to corpus luteum is a indeterminate complex, thick-walled cystic mass. Question small fetal pole within this structure. Left ovary: Not visualized. Other :Fluid and echogenic material is identified within the endometrial cavity. Free fluid: A moderate amount of complex fluid is identified within the pelvis. IMPRESSION: 1. No intrauterine gestational sac, yolk sac, or fetal pole identified. Differential considerations include intrauterine pregnancy too early to be sonographically visualized, missed abortion, or ectopic pregnancy. Followup ultrasound is recommended in 10-14 days for further evaluation. 2. Complex thick walled cystic mass adjacent to right ovary corpus luteum is noted with questionable small fetal pole. Suspicious for ectopic pregnancy. 3. Moderate complex free fluid noted within the pelvis. 4. Critical Value/emergent results were called by telephone at the time of interpretation on 09/24/2018 at 11:53 am to Dr. Raelyn MoraOLITTA DAWSON , who verbally acknowledged these results. Electronically Signed   By: Signa Kellaylor  Stroud M.D.   On: 09/24/2018 11:53      Assessment and Plan  Ectopic pregnancy without intrauterine pregnancy - To OR for surgical removal of ectopic pregnancy - Care assumed by Dr. Macon LargeAnyanwu at the time of admission    Raelyn Moraolitta Dawson, MSN, CNM 09/24/2018, 10:10  AM     Attestation of Attending Supervision of Advanced Practice Provider (PA/CNM/NP): Evaluation and management procedures were performed by the Advanced Practice Provider under my supervision and collaboration.  I have reviewed the Advanced Practice Provider's note and chart, and I agree with the management and plan.  33 y.o. [id number] with ruptured ectopic pregnancy.   On exam, she had stable vital signs, and tender abdomen. Patient was counseled regarding need for urgent surgery: laparoscopic removal of fallopian tube and ectopic pregnancy. Risks of surgery including bleeding which may require transfusion or reoperation, infection, injury to bowel or other surrounding organs, need for additional procedures including  laparotomy were explained to patient and written informed consent was obtained.  Patient has been NPO since 0845 and she will remain NPO for procedure. Anesthesia and OR aware.  SCDs ordered on call to the OR.  To OR when ready.    Jaynie CollinsUGONNA  Diya Gervasi, MD, FACOG Obstetrician & Gynecologist, [facility] for [employer], [facility]

## 2018-09-24 NOTE — MAU Provider Note (Signed)
History     CSN: [id number]  Arrival date and time: 09/24/18 [id number]   First Provider Initiated Contact with Patient 09/24/18 1008      Chief Complaint  Patient presents with  . Vaginal Bleeding  . Abdominal Pain  . Possible Pregnancy   HPI  [name] is a 33 y.o. year old [id number] female at 4179w2d weeks gestation who presents to MAU reporting (+) UPT at [facility] a couple of days before Christmas. She reports having BRB x 2 days, but none today. This is an undesired pregnancy. She was scheduled to have an abortion on 09/21/18, but there was no IUP seen on the U/S. She was then scheduled for a F/U in 2 wks (on 10/11/2018). She reports she started having sharp, lower abdominal pain, vagina spotting, light-headedness and vomiting x 2 episodes. She states the pain is worse with lying down; hurts if lying on either side as well.  Past Medical History:  Diagnosis Date  . Anemia   . Hypertension     Past Surgical History:  Procedure Laterality Date  . CESAREAN SECTION      Family History  Adopted: Yes    Social History   Tobacco Use  . Smoking status: Never Smoker  . Smokeless tobacco: Never Used  Substance Use Topics  . Alcohol use: Yes    Comment: occa  . Drug use: No    Allergies: No Known Allergies  Medications Prior to Admission  Medication Sig Dispense Refill Last Dose  . hydrochlorothiazide (HYDRODIURIL) 25 MG tablet Take by mouth.   09/17/2018  . metoprolol succinate (TOPROL-XL) 100 MG 24 hr tablet Take 100 mg by mouth daily. Take with or immediately following a meal.   09/24/2018 at Unknown time    Review of Systems  Constitutional: Negative.   HENT: Negative.   Eyes: Negative.   Respiratory: Negative.   Cardiovascular: Negative.   Gastrointestinal: Negative.   Endocrine: Negative.   Genitourinary: Positive for pelvic pain.  Musculoskeletal: Negative.   Skin: Negative.   Allergic/Immunologic: Negative.   Neurological: Negative.    Hematological: Negative.   Psychiatric/Behavioral: Negative.    Physical Exam   Blood pressure 132/80, pulse 79, temperature 98.2 F (36.8 C), temperature source Oral, resp. rate 17, weight 89 kg, last menstrual period 07/28/2018, SpO2 100 %.  Physical Exam  Nursing note and vitals reviewed. Constitutional: She is oriented to person, place, and time. She appears well-developed and well-nourished.  HENT:  Head: Normocephalic and atraumatic.  Eyes: Pupils are equal, round, and reactive to light.  Neck: Normal range of motion.  Cardiovascular: Normal rate, regular rhythm, normal heart sounds and intact distal pulses.  Respiratory: Effort normal and breath sounds normal.  GI: Soft. Bowel sounds are normal.  Genitourinary:    Genitourinary Comments: Uterus: mildly tender, SE: cervix is smooth, pink, no lesions, small amt of tannish vaginal d/c -- WP, GC/CT done, closed/long/firm, no CMT or friability, exquisite bilateral adnexal tenderness    Musculoskeletal: Normal range of motion.  Neurological: She is alert and oriented to person, place, and time. She has normal reflexes.  Skin: Skin is warm and dry.  Psychiatric: She has a normal mood and affect. Her behavior is normal. Judgment and thought content normal.    MAU Course  Procedures  MDM CCUA UPT CBC ABO/Rh HCG Wet Prep GC/CT -- pending HIV -- pending OB < 14 wks US with TV Prep for OR -- patient last ate "handful" of frosted mini wheats @  0845 and drank OJ @ 0400  **TC from Dr. Bradly Chris at 1152 giving verbal report of U/S  *Consult with Dr. Macon Large @ 1156 - notified of patient's complaints, assessments, lab & U/S results, recommended tx plan prep for OR  Results for orders placed or performed during the hospital encounter of 09/24/18 (from the past 24 hour(s))  Urinalysis, Routine w reflex microscopic     Status: Abnormal   Collection Time: 09/24/18  9:30 AM  Result Value Ref Range   Color, Urine AMBER (A) YELLOW    APPearance HAZY (A) CLEAR   Specific Gravity, Urine 1.025 1.005 - 1.030   pH 6.0 5.0 - 8.0   Glucose, UA NEGATIVE NEGATIVE mg/dL   Hgb urine dipstick NEGATIVE NEGATIVE   Bilirubin Urine NEGATIVE NEGATIVE   Ketones, ur NEGATIVE NEGATIVE mg/dL   Protein, ur 30 (A) NEGATIVE mg/dL   Nitrite NEGATIVE NEGATIVE   Leukocytes, UA NEGATIVE NEGATIVE   RBC / HPF 0-5 0 - 5 RBC/hpf   WBC, UA 6-10 0 - 5 WBC/hpf   Bacteria, UA RARE (A) NONE SEEN   Squamous Epithelial / LPF 11-20 0 - 5   Mucus PRESENT   Pregnancy, urine POC     Status: Abnormal   Collection Time: 09/24/18  9:46 AM  Result Value Ref Range   Preg Test, Ur POSITIVE (A) NEGATIVE  Wet prep, genital     Status: Abnormal   Collection Time: 09/24/18 10:19 AM  Result Value Ref Range   Yeast Wet Prep HPF POC NONE SEEN NONE SEEN   Trich, Wet Prep NONE SEEN NONE SEEN   Clue Cells Wet Prep HPF POC PRESENT (A) NONE SEEN   WBC, Wet Prep HPF POC FEW (A) NONE SEEN   Sperm NONE SEEN   CBC     Status: Abnormal   Collection Time: 09/24/18 10:43 AM  Result Value Ref Range   WBC 7.6 4.0 - 10.5 K/uL   RBC 4.02 3.87 - 5.11 MIL/uL   Hemoglobin 9.4 (L) 12.0 - 15.0 g/dL   HCT 16.1 (L) 09.6 - 04.5 %   MCV 75.1 (L) 80.0 - 100.0 fL   MCH 23.4 (L) 26.0 - 34.0 pg   MCHC 31.1 30.0 - 36.0 g/dL   RDW 40.9 (H) 81.1 - 91.4 %   Platelets 217 150 - 400 K/uL   nRBC 0.0 0.0 - 0.2 %  ABO/Rh     Status: None (Preliminary result)   Collection Time: 09/24/18 10:43 AM  Result Value Ref Range   ABO/RH(D)      B POS Performed at [facility], [address] [place], [place] [address]   hCG, quantitative, pregnancy     Status: Abnormal   Collection Time: 09/24/18 10:43 AM  Result Value Ref Range   hCG, Beta Chain, Quant, S 74,765 (H) <5 mIU/mL    US Ob Less Than 14 Weeks With Ob Transvaginal  Result Date: 09/24/2018 CLINICAL DATA:  Abdominal bleeding in early pregnancy. Abdominal pain. EXAM: OBSTETRIC <14 WK [place] AND TRANSVAGINAL OB US TECHNIQUE: Both  transabdominal and transvaginal ultrasound examinations were performed for complete evaluation of the gestation as well as the maternal uterus, adnexal regions, and pelvic cul-de-sac. Transvaginal technique was performed to assess early pregnancy. COMPARISON:  03/29/2016 FINDINGS: Intrauterine gestational sac: Not visualized Yolk sac:  Not visualized Embryo:  Not visualize Maternal uterus/adnexae: Right ovary: Corpus luteum identified within the right ovary. Adjacent to corpus luteum is a indeterminate complex, thick-walled cystic mass. Question small fetal pole  within this structure. Left ovary: Not visualized. Other :Fluid and echogenic material is identified within the endometrial cavity. Free fluid: A moderate amount of complex fluid is identified within the pelvis. IMPRESSION: 1. No intrauterine gestational sac, yolk sac, or fetal pole identified. Differential considerations include intrauterine pregnancy too early to be sonographically visualized, missed abortion, or ectopic pregnancy. Followup ultrasound is recommended in 10-14 days for further evaluation. 2. Complex thick walled cystic mass adjacent to right ovary corpus luteum is noted with questionable small fetal pole. Suspicious for ectopic pregnancy. 3. Moderate complex free fluid noted within the pelvis. 4. Critical Value/emergent results were called by telephone at the time of interpretation on 09/24/2018 at 11:53 am to Dr. Raelyn MoraOLITTA Sarah Rios , who verbally acknowledged these results. Electronically Signed   By: Signa Kellaylor  Stroud M.D.   On: 09/24/2018 11:53    Assessment and Plan  Ectopic pregnancy without intrauterine pregnancy - To OR for surgical removal of ectopic pregnancy - Care assumed bu Dr. Macon LargeAnyanwu at the time of admission   Sarah Moraolitta Jowanda Heeg, MSN, CNM 09/24/2018, 10:10 AM

## 2018-09-24 NOTE — MAU Note (Signed)
[facility] A couple days before Christmas, had some bright red bleeding.  Lasted for a couple days.  Was scheduled to have an abortion on 12/31. When she got there, they did an [place] - did not see anything- is to f/u in 2 wks.  Last night, got a really sharp pain in her stomach, started cramping, had some spotting, was light headed, threw up a couple times.  Can't lay on back and which ever side she lays on - she hurts on that side.

## 2018-09-24 NOTE — Transfer of Care (Signed)
Immediate Anesthesia Transfer of Care Note  Patient: Sarah Rios  Procedure(s) Performed: LAPAROSCOPY  WITH RIGHT SALPINGECTOMY AND REMOVAL OF ECTOPIC PREGNANCY (Right )  Patient Location: PACU  Anesthesia Type:General  Level of Consciousness: sedated  Airway & Oxygen Therapy: Patient Spontanous Breathing and Patient connected to nasal cannula oxygen  Post-op Assessment: Report given to RN and Post -op Vital signs reviewed and stable  Post vital signs: Reviewed and stable  Last Vitals:  Vitals Value Taken Time  BP 116/91 09/24/2018  3:18 PM  Temp    Pulse 94 09/24/2018  3:20 PM  Resp 18 09/24/2018  3:20 PM  SpO2 91 % 09/24/2018  3:20 PM  Vitals shown include unvalidated device data.  Last Pain:  Vitals:   09/24/18 0928  TempSrc: Oral  PainSc: 5          Complications: No apparent anesthesia complications

## 2018-09-24 NOTE — Anesthesia Preprocedure Evaluation (Signed)
Anesthesia Evaluation  Patient identified by MRN, date of birth, ID band Patient awake    Reviewed: Allergy & Precautions, NPO status , Patient's Chart, lab work & pertinent test results, reviewed documented beta blocker date and time   Airway Mallampati: II  TM Distance: >3 FB Neck ROM: Full    Dental no notable dental hx.    Pulmonary neg pulmonary ROS,    Pulmonary exam normal breath sounds clear to auscultation       Cardiovascular hypertension, Pt. on medications and Pt. on home beta blockers negative cardio ROS Normal cardiovascular exam Rhythm:Regular Rate:Normal     Neuro/Psych negative neurological ROS  negative psych ROS   GI/Hepatic negative GI ROS, Neg liver ROS,   Endo/Other  negative endocrine ROS  Renal/GU negative Renal ROS  negative genitourinary   Musculoskeletal negative musculoskeletal ROS (+)   Abdominal   Peds negative pediatric ROS (+)  Hematology negative hematology ROS (+) anemia ,   Anesthesia Other Findings Ectopic pregnancy  Reproductive/Obstetrics negative OB ROS                             Anesthesia Physical Anesthesia Plan  ASA: II and emergent  Anesthesia Plan: General   Post-op Pain Management:    Induction: Intravenous, Rapid sequence and Cricoid pressure planned  PONV Risk Score and Plan: 3 and Ondansetron, Dexamethasone and Midazolam  Airway Management Planned: Oral ETT  Additional Equipment:   Intra-op Plan:   Post-operative Plan: Extubation in OR  Informed Consent: I have reviewed the patients History and Physical, chart, labs and discussed the procedure including the risks, benefits and alternatives for the proposed anesthesia with the patient or authorized representative who has indicated his/her understanding and acceptance.   Dental advisory given  Plan Discussed with: CRNA  Anesthesia Plan Comments:         Anesthesia  Quick Evaluation

## 2018-09-24 NOTE — Discharge Instructions (Signed)
Laparoscopic Surgery - Care After Laparoscopy is a surgical procedure. It is used to diagnose and treat diseases inside the belly(abdomen). It is usually a brief, common, and relatively simple procedure. The laparoscopeis a thin, lighted, pencil-sized instrument. It is like a telescope. It is inserted into your abdomen through a small cut (incision). Your caregiver can look at the organs inside your body through this instrument.  She can see if there is anything abnormal. Laparoscopy can be done either in a hospital or outpatient clinic. You may be given a mild sedative to help you relax before the procedure. Once in the operating room, you will be given a drug to make you sleep (general anesthesia). Laparoscopy usually lasts about 1 hour. After the procedure, you will be monitored in a recovery area until you are stable and doing well. Once you are home, it may take 3 to 7 days to fully recover.  Laparoscopy has relatively few risks. Your caregiver will discuss the risks with you before the procedure. Some problems that can occur include: RISKS AND COMPLICATIONS  Allergies to medicines. Difficulty breathing. Bleeding. Infection. Damage to other surrounding structures HOME CARE INSTRUCTIONS  Infection. Bleeding. Damage to other organs. Anesthetic side effects.  Need for additional procedures such as open procedures/laparotomy PROCEDURE Once you receive anesthesia, your surgeon inflates the abdomen with a harmless gas (carbon dioxide). This makes the organs easier to see. The laparoscope is inserted into the abdomen through a small incision. This allows your surgeon to see into the abdomen. Other small instruments are also inserted into the abdomen through other small openings. Many surgeons attach a video camera to the laparoscope to enlarge the view. During a laparoscopy, the surgeon may be looking for inflammation, infection, or cancer.  The surgeon may also need to take out certain organs or  take tissue samples (biopsies). The specimens are sent to a specialist in looking at cells and tissue samples (pathologist). The pathologist examines them under a microscope to help to diagnose or confirm a disease. AFTER THE PROCEDURE  The incisions are closed with stitches (sutures) and Dermabond. Because these incisions are small (usually less than 1/2 inch), there is usually minimal discomfort after the procedure. There may also be discomfort from the instrument placement incisions in the abdomen. You will be given pain medicine to ease any discomfort. You will rest in a recovery room for 1-2 hours until you are stable and doing well. You may have some mild discomfort in the throat. This is from the tube placed in your throat while you were sleeping. You may experience discomfort in the shoulder area from some trapped air between the liver and diaphragm. This sensation is normal and will slowly go away on its own. The recovery time is shortened as long as there are no complications. You will rest in a recovery room until stable and doing well. As long as there are no complications, you may be allowed to go home. Someone will need to drive you home and be with you for at least 24 hours once home. FINDING OUT THE RESULTS You will be called with the results of the pathology and will discuss these results with  your caregiver during your postoperative appointment. Do not assume everything is normal if you have not heard from your caregiver or the medical facility. It is important for you to follow up on all of your results. HOME CARE INSTRUCTIONS  Take all medicines as directed. Only take over-the-counter or prescription medicines for pain, discomfort,   CARE INSTRUCTIONS   Take all medicines as directed.  Only take over-the-counter or prescription medicines for pain, discomfort, or fever as directed by your caregiver.  Resume daily activities as directed.  Showers are preferred over baths.  You may resume sexual activities in 1 week or as directed.  Do not drive while taking  narcotics. SEEK MEDICAL CARE IF:  There is increasing abdominal pain.  You feel lightheaded or faint.  You have the chills.  You have an oral temperature above 102 F (38.9 C).  There is pus-like (purulent) drainage from any of the wounds.  You are unable to pass gas or have a bowel movement.  You feel sick to your stomach (nauseous) or throw up (vomit). MAKE SURE YOU:   Understand these instructions.  Will watch your condition.  Will get help right away if you are not doing well or get worse.  ExitCare Patient Information 2013 [place], [place].    Post Anesthesia Home Care Instructions  Activity: Get plenty of rest for the remainder of the day. A responsible individual must stay with you for 24 hours following the procedure.  For the next 24 hours, DO NOT: -Drive a car -Advertising copywriter -Drink alcoholic beverages -Take any medication unless instructed by your physician -Make any legal decisions or sign important papers.  Meals: Start with liquid foods such as gelatin or soup. Progress to regular foods as tolerated. Avoid greasy, spicy, heavy foods. If nausea and/or vomiting occur, drink only clear liquids until the nausea and/or vomiting subsides. Call your physician if vomiting continues.  Special Instructions/Symptoms: Your throat may feel dry or sore from the anesthesia or the breathing tube placed in your throat during surgery. If this causes discomfort, gargle with warm salt water. The discomfort should disappear within 24 hours.  If you had a scopolamine patch placed behind your ear for the management of post- operative nausea and/or vomiting:  1. The medication in the patch is effective for 72 hours, after which it should be removed.  Wrap patch in a tissue and discard in the trash. Wash hands thoroughly with soap and water. 2. You may remove the patch earlier than 72 hours if you experience unpleasant side effects which may include dry mouth, dizziness or  visual disturbances. 3. Avoid touching the patch. Wash your hands with soap and water after contact with the patch.     NO IBUPROFEN PRODUCTS UNTIL 8:50PM TODAY IF NEEDED.

## 2018-09-24 NOTE — Anesthesia Postprocedure Evaluation (Signed)
Anesthesia Post Note  Patient: Sarah Rios  Procedure(s) Performed: LAPAROSCOPY  WITH RIGHT SALPINGECTOMY AND REMOVAL OF ECTOPIC PREGNANCY (Right )     Patient location during evaluation: PACU Anesthesia Type: General Level of consciousness: awake and alert Pain management: pain level controlled Vital Signs Assessment: post-procedure vital signs reviewed and stable Respiratory status: spontaneous breathing, nonlabored ventilation and respiratory function stable Cardiovascular status: blood pressure returned to baseline and stable Postop Assessment: no apparent nausea or vomiting Anesthetic complications: no    Last Vitals:  Vitals:   09/24/18 1600 09/24/18 1615  BP: (!) 126/96 133/66  Pulse: 83 84  Resp: 16 18  Temp:  36.9 C  SpO2: 98% 100%    Last Pain:  Vitals:   09/24/18 1615  TempSrc:   PainSc: 0-No pain   Pain Goal:                 Lowella Curb

## 2018-09-25 ENCOUNTER — Encounter (HOSPITAL_COMMUNITY): Payer: Self-pay | Admitting: Obstetrics & Gynecology

## 2018-09-25 LAB — HIV ANTIBODY (ROUTINE TESTING W REFLEX): HIV SCREEN 4TH GENERATION: NONREACTIVE

## 2018-09-27 LAB — GC/CHLAMYDIA PROBE AMP (~~LOC~~) NOT AT ARMC
Chlamydia: NEGATIVE
NEISSERIA GONORRHEA: NEGATIVE

## 2018-09-29 ENCOUNTER — Telehealth: Payer: Self-pay

## 2018-09-29 NOTE — Telephone Encounter (Signed)
Pt called and stated that she had her right fallopian tube removed on Friday and her bleeding is getting heavier.  Can someone give her a call back?

## 2018-09-30 ENCOUNTER — Encounter: Payer: Self-pay | Admitting: General Practice

## 2018-09-30 ENCOUNTER — Encounter: Payer: Self-pay | Admitting: Family Medicine

## 2018-09-30 DIAGNOSIS — G8918 Other acute postprocedural pain: Secondary | ICD-10-CM

## 2018-09-30 MED ORDER — IBUPROFEN 800 MG PO TABS: 800.0000 mg | ORAL_TABLET | Freq: Four times a day (QID) | ORAL | 0 refills | Status: DC | PRN

## 2018-09-30 NOTE — Progress Notes (Signed)
Patient came by office today requesting refill on percocet as she is completely out. Patient also would like to be out another week- states she is still feeling pretty sore and is moving around slowly. Per Dr Shawnie PonsPratt, patient can be out another week but should use motrin at this point for pain relief. Informed patient of new return to work date and discussed normal healing/pain process after surgery. Reassured patient pain would continue improve each day but soreness was normal. Advised more frequent breaks in between activities. Patient verbalized understanding and asked about bleeding since surgery. Discussed bleeding at this point is still to be expected but should improve within the next few days. Patient verbalized understanding & asked for higher dose of motrin. Ibuprofen 800mg  prescribed per protocol. Patient had no other questions.

## 2018-10-05 NOTE — Telephone Encounter (Signed)
Pt's concern addressed on 09/30/18.

## 2018-10-13 ENCOUNTER — Ambulatory Visit (INDEPENDENT_AMBULATORY_CARE_PROVIDER_SITE_OTHER): Payer: BLUE CROSS/BLUE SHIELD | Admitting: Obstetrics & Gynecology

## 2018-10-13 ENCOUNTER — Encounter: Payer: Self-pay | Admitting: Obstetrics & Gynecology

## 2018-10-13 VITALS — BP 156/101 | HR 57 | Wt 195.4 lb

## 2018-10-13 DIAGNOSIS — Z09 Encounter for follow-up examination after completed treatment for conditions other than malignant neoplasm: Secondary | ICD-10-CM

## 2018-10-13 DIAGNOSIS — Z9889 Other specified postprocedural states: Secondary | ICD-10-CM

## 2018-10-13 DIAGNOSIS — I10 Essential (primary) hypertension: Secondary | ICD-10-CM

## 2018-10-13 NOTE — Progress Notes (Signed)
Subjective:     Sarah Rios is a 33 y.o. [id number] female who presents to the clinic 3 weeks status post laparoscopic right salpingectomy and removal of ectopic pregnancy for ruptured ectopic pregnancy. Eating a regular diet without difficulty. Bowel movements are normal. The patient has minimal pain on right site. Taking NSAIDs as needed for pain.  The following portions of the patient's history were reviewed and updated as appropriate: allergies, current medications, past family history, past medical history, past social history, past surgical history and problem list.  Review of Systems Pertinent items noted in HPI and remainder of comprehensive ROS otherwise negative.    Objective:    BP (!) 156/101 (BP Location: Left Arm, Patient Position: Sitting, Cuff Size: Normal)   Pulse (!) 57   Wt 195 lb 6.4 oz (88.6 kg)   LMP 07/28/2018   Breastfeeding Unknown   BMI 36.32 kg/m  General:  alert and no distress  Abdomen: soft, bowel sounds active, non-tender  Incision:   healing well, no drainage, no erythema, no hernia, no seroma, no swelling, no dehiscence, incision well approximated    09/24/2018 Surgical Pathology Fallopian tube, ectopic pregnancy, Right - DILATED FALLOPIAN TUBE WITH ASSOCIATED CHORIONIC VILLI, CONSISTENT WITH CLINICALLY STATED ECTOPIC PREGNANCY.  Assessment:   Doing well postoperatively. Operative findings again reviewed. Pathology report discussed.    Plan:   1. Continue any current medications. 2. Wound care discussed. 3. Activity restrictions: none 4. Told to follow up with PCP for adequate antihypertensive management 5. Follow up as needed for GYN concerns; plans to follow up with [facility] for pap and contraception.   Jaynie Collins, MD, FACOG Obstetrician & Gynecologist, [facility] for [employer], [facility]

## 2018-10-13 NOTE — Patient Instructions (Signed)
Return to clinic for any scheduled appointments or for any gynecologic concerns as needed.   

## 2019-02-03 ENCOUNTER — Encounter: Payer: Self-pay | Admitting: *Deleted

## 2020-05-29 ENCOUNTER — Other Ambulatory Visit: Payer: Self-pay | Admitting: Obstetrics and Gynecology

## 2020-05-29 DIAGNOSIS — Z3201 Encounter for pregnancy test, result positive: Secondary | ICD-10-CM

## 2020-06-05 ENCOUNTER — Ambulatory Visit: Payer: 59 | Attending: Obstetrics and Gynecology

## 2020-06-24 ENCOUNTER — Other Ambulatory Visit: Payer: Self-pay

## 2020-06-24 ENCOUNTER — Emergency Department (HOSPITAL_BASED_OUTPATIENT_CLINIC_OR_DEPARTMENT_OTHER)
Admission: EM | Admit: 2020-06-24 | Discharge: 2020-06-24 | Disposition: A | Discharge status: Home / Self Care | Payer: No Typology Code available for payment source | Attending: Emergency Medicine | Admitting: Emergency Medicine

## 2020-06-24 ENCOUNTER — Telehealth: Payer: Self-pay

## 2020-06-24 ENCOUNTER — Emergency Department (HOSPITAL_BASED_OUTPATIENT_CLINIC_OR_DEPARTMENT_OTHER): Payer: No Typology Code available for payment source

## 2020-06-24 ENCOUNTER — Encounter (HOSPITAL_BASED_OUTPATIENT_CLINIC_OR_DEPARTMENT_OTHER): Payer: Self-pay | Admitting: Emergency Medicine

## 2020-06-24 DIAGNOSIS — Y92410 Unspecified street and highway as the place of occurrence of the external cause: Secondary | ICD-10-CM | POA: Insufficient documentation

## 2020-06-24 DIAGNOSIS — Y9389 Activity, other specified: Secondary | ICD-10-CM | POA: Insufficient documentation

## 2020-06-24 DIAGNOSIS — H6092 Unspecified otitis externa, left ear: Secondary | ICD-10-CM | POA: Insufficient documentation

## 2020-06-24 DIAGNOSIS — I10 Essential (primary) hypertension: Secondary | ICD-10-CM | POA: Insufficient documentation

## 2020-06-24 DIAGNOSIS — S161XXA Strain of muscle, fascia and tendon at neck level, initial encounter: Secondary | ICD-10-CM

## 2020-06-24 DIAGNOSIS — Z79899 Other long term (current) drug therapy: Secondary | ICD-10-CM | POA: Diagnosis not present

## 2020-06-24 DIAGNOSIS — H60502 Unspecified acute noninfective otitis externa, left ear: Secondary | ICD-10-CM

## 2020-06-24 DIAGNOSIS — S199XXA Unspecified injury of neck, initial encounter: Secondary | ICD-10-CM | POA: Diagnosis present

## 2020-06-24 MED ORDER — ACETAMINOPHEN 325 MG PO TABS
650.0000 mg | ORAL_TABLET | Freq: Once | ORAL | Status: AC
  Administered 2020-06-24: 650 mg via ORAL
  Filled 2020-06-24: qty 2

## 2020-06-24 MED ORDER — METHOCARBAMOL 500 MG PO TABS
500.0000 mg | ORAL_TABLET | Freq: Once | ORAL | Status: AC
  Administered 2020-06-24: 500 mg via ORAL
  Filled 2020-06-24: qty 1

## 2020-06-24 MED ORDER — LIDOCAINE 5 % EX PTCH: 1.0000 | MEDICATED_PATCH | CUTANEOUS | 0 refills | Status: AC

## 2020-06-24 MED ORDER — FLUCONAZOLE 200 MG PO TABS: 200.0000 mg | ORAL_TABLET | Freq: Every day | ORAL | 0 refills | Status: AC

## 2020-06-24 MED ORDER — AMOXICILLIN 500 MG PO CAPS: 500.0000 mg | ORAL_CAPSULE | Freq: Three times a day (TID) | ORAL | 0 refills | Status: DC

## 2020-06-24 MED ORDER — METHOCARBAMOL 500 MG PO TABS: 500.0000 mg | ORAL_TABLET | Freq: Two times a day (BID) | ORAL | 0 refills | Status: AC
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
## 2020-06-24 NOTE — ED Notes (Signed)
Pt endorses 500mg  tylenol at approx 10am today
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
## 2020-06-24 NOTE — Telephone Encounter (Signed)
[facility] called to state that only 2 scripts came through, the paitents states there were two more scripts that needed to be filled. Patient looked up, there were two scripts that were called in, amoxicillin and lidoderm patch.

## 2020-06-24 NOTE — Discharge Instructions (Signed)
Amoxicillin for ear infection--Diflucan for yeast, take 1 tablet for first dose of amoxicillin.  If develops yeast infection symptoms after 3 days take additional tablet  Robaxin which is a muscle relaxer and lidocaine patches for your neck pain.  Follow-up with orthopedics.  Return for any worsening symptoms.

## 2021-05-16 ENCOUNTER — Other Ambulatory Visit: Payer: Self-pay

## 2021-05-16 ENCOUNTER — Encounter (HOSPITAL_BASED_OUTPATIENT_CLINIC_OR_DEPARTMENT_OTHER): Payer: Self-pay

## 2021-05-16 ENCOUNTER — Emergency Department (HOSPITAL_BASED_OUTPATIENT_CLINIC_OR_DEPARTMENT_OTHER)
Admission: EM | Admit: 2021-05-16 | Discharge: 2021-05-16 | Disposition: A | Discharge status: Home / Self Care | Payer: BC Managed Care – PPO | Attending: Emergency Medicine | Admitting: Emergency Medicine

## 2021-05-16 DIAGNOSIS — M542 Cervicalgia: Secondary | ICD-10-CM | POA: Insufficient documentation

## 2021-05-16 DIAGNOSIS — Y92410 Unspecified street and highway as the place of occurrence of the external cause: Secondary | ICD-10-CM | POA: Diagnosis not present

## 2021-05-16 DIAGNOSIS — M549 Dorsalgia, unspecified: Secondary | ICD-10-CM | POA: Insufficient documentation

## 2021-05-16 DIAGNOSIS — Z5321 Procedure and treatment not carried out due to patient leaving prior to being seen by health care provider: Secondary | ICD-10-CM | POA: Diagnosis not present

## 2021-05-16 DIAGNOSIS — M25562 Pain in left knee: Secondary | ICD-10-CM | POA: Insufficient documentation
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
## 2022-11-27 ENCOUNTER — Emergency Department (HOSPITAL_BASED_OUTPATIENT_CLINIC_OR_DEPARTMENT_OTHER)
Admission: EM | Admit: 2022-11-27 | Discharge: 2022-11-27 | Disposition: A | Discharge status: Home / Self Care | Payer: Managed Care, Other (non HMO) | Attending: Emergency Medicine | Admitting: Emergency Medicine

## 2022-11-27 ENCOUNTER — Encounter (HOSPITAL_BASED_OUTPATIENT_CLINIC_OR_DEPARTMENT_OTHER): Payer: Self-pay

## 2022-11-27 ENCOUNTER — Other Ambulatory Visit: Payer: Self-pay

## 2022-11-27 DIAGNOSIS — M545 Low back pain, unspecified: Secondary | ICD-10-CM | POA: Diagnosis present

## 2022-11-27 DIAGNOSIS — I10 Essential (primary) hypertension: Secondary | ICD-10-CM | POA: Diagnosis not present

## 2022-11-27 DIAGNOSIS — Z79899 Other long term (current) drug therapy: Secondary | ICD-10-CM | POA: Diagnosis not present

## 2022-11-27 LAB — URINALYSIS, ROUTINE W REFLEX MICROSCOPIC
Bilirubin Urine: NEGATIVE
Glucose, UA: NEGATIVE mg/dL
Hgb urine dipstick: NEGATIVE
Ketones, ur: NEGATIVE mg/dL
Leukocytes,Ua: NEGATIVE
Nitrite: NEGATIVE
Protein, ur: NEGATIVE mg/dL
Specific Gravity, Urine: 1.025 (ref 1.005–1.030)
pH: 7 (ref 5.0–8.0)

## 2022-11-27 LAB — PREGNANCY, URINE: Preg Test, Ur: NEGATIVE

## 2022-11-27 MED ORDER — KETOROLAC TROMETHAMINE 30 MG/ML IJ SOLN
30.0000 mg | Freq: Once | INTRAMUSCULAR | Status: AC
  Administered 2022-11-27: 30 mg via INTRAMUSCULAR
  Filled 2022-11-27: qty 1

## 2022-11-27 MED ORDER — CYCLOBENZAPRINE HCL 10 MG PO TABS: 10.0000 mg | ORAL_TABLET | Freq: Two times a day (BID) | ORAL | 0 refills | Status: AC | PRN

## 2022-11-27 MED ORDER — IBUPROFEN 600 MG PO TABS: 600.0000 mg | ORAL_TABLET | Freq: Four times a day (QID) | ORAL | 0 refills | Status: AC | PRN

## 2022-11-27 NOTE — Discharge Instructions (Signed)
Note the urine was without signs of infection or bleeding.  Symptoms most likely secondary to muscular injury.  As discussed, we will send in NSAIDs in the form of high-dose ibuprofen as well as muscle relaxer to use as needed.  Note that muscle laxer can cause drowsiness so please do not drive or operate machinery while taking this medication until you realize its effects on you.  Recommend reevaluation by primary care for reassessment of symptoms.  Please do not hesitate to return to emergency department if the worrisome signs and symptoms we discussed become apparent.

## 2022-11-27 NOTE — ED Triage Notes (Signed)
C/o back pain x 2 days started on left side now across lumbar back. Denies known injury, but states has been sleeping on the couch the past week. States pain improves with pain meds. Denies urinary symptoms.

## 2022-11-27 NOTE — ED Notes (Signed)
Discharge instructions reviewed with patient. Patient verbalizes understanding, no further questions at this time. Medications/prescriptions and follow up information provided. No acute distress noted at time of departure.  

## 2022-11-27 NOTE — ED Provider Notes (Signed)
[place] EMERGENCY DEPARTMENT AT [facility] Provider Note   CSN: [id number] Arrival date & time: 11/27/22  0831     History  Chief Complaint  Patient presents with   Back Pain    Sarah Rios is a 37 y.o. female.   Back Pain   37 year old female presents emergency department with complaints of back pain.  Patient states that back pain began on Tuesday when she was laying on her couch.  Patient noted twisting type motion when she was changing positions and noted sharp bilateral low back pain.  States that she has been trying at home NSAIDs, Tylenol, muscle relaxer which has helped symptoms temporarily.  Reports some associated nausea but is not currently feeling nauseous.  Presents emergency department for further evaluation.  Denies fever, chest pain, shortness of breath, urinary/vaginal symptoms, change in bowel habits.  Denies radiation of pain.  Denies weakness/sensory deficit lower extremities, saddle anesthesia, bowel/bladder dysfunction, history of IV drug use, known malignancy.   Past medical history significant for hypertension, anemia  Home Medications Prior to Admission medications   Medication Sig Start Date End Date Taking? Authorizing Provider  cyclobenzaprine (FLEXERIL) 10 MG tablet Take 1 tablet (10 mg total) by mouth 2 (two) times daily as needed for muscle spasms. 11/27/22  Yes Dion Saucier A, PA  ibuprofen (ADVIL) 600 MG tablet Take 1 tablet (600 mg total) by mouth every 6 (six) hours as needed. 11/27/22  Yes Dion Saucier A, PA  amoxicillin (AMOXIL) 500 MG capsule Take 1 capsule (500 mg total) by mouth 3 (three) times daily. 06/24/20   Henderly, Britni A, PA-C  docusate sodium (COLACE) 100 MG capsule Take 1 capsule (100 mg total) by mouth 2 (two) times daily as needed for mild constipation or moderate constipation. Patient not taking: Reported on 10/13/2018 09/24/18   Osborne Oman, MD  FLUoxetine (PROZAC) 20 MG capsule Take 1 capsule by mouth  daily. 03/29/20   [provider]  hydrochlorothiazide (HYDRODIURIL) 25 MG tablet Take by mouth. 08/17/18 08/17/19  [provider]  labetalol (NORMODYNE) 100 MG tablet Take by mouth. 05/22/20   [provider]  lidocaine (LIDODERM) 5 % Place 1 patch onto the skin daily. Remove & Discard patch within 12 hours or as directed by MD 06/24/20   Henderly, Britni A, PA-C  methocarbamol (ROBAXIN) 500 MG tablet Take 1 tablet (500 mg total) by mouth 2 (two) times daily. 06/24/20   Henderly, Britni A, PA-C  metoprolol succinate (TOPROL-XL) 100 MG 24 hr tablet Take 100 mg by mouth daily. Take with or immediately following a meal.    [provider]      Allergies    Patient has no known allergies.    Review of Systems   Review of Systems  Musculoskeletal:  Positive for back pain.  All other systems reviewed and are negative.   Physical Exam Updated Vital Signs BP 116/87 (BP Location: Right Arm)   Pulse 62   Temp 98.6 F (37 C) (Oral)   Resp 16   Ht '5\' 3"'$  (1.6 m)   Wt 83.9 kg   LMP 11/16/2022 (Exact Date)   SpO2 97%   BMI 32.77 kg/m  Physical Exam Vitals and nursing note reviewed.  Constitutional:      General: She is not in acute distress.    Appearance: She is well-developed.  HENT:     Head: Normocephalic and atraumatic.  Eyes:     Conjunctiva/sclera: Conjunctivae normal.  Cardiovascular:  Rate and Rhythm: Normal rate and regular rhythm.     Heart sounds: No murmur heard. Pulmonary:     Effort: Pulmonary effort is normal. No respiratory distress.     Breath sounds: Normal breath sounds.  Abdominal:     Palpations: Abdomen is soft.     Tenderness: There is no abdominal tenderness.     Comments: No anterior abdominal tenderness.  Musculoskeletal:        General: No swelling.     Cervical back: Neck supple.     Comments: No midline tenderness of cervical, thoracic, lumbar spine.  No tender palpation of lower extremities.  Muscular strength 5  out of 5 for lower extremities.  Patient has full range of motion of bilateral hips, knees, ankles, digits.  Pedal pulses 2+ bilaterally.  No sensory deficits along major nerve distributions of lower extremities.  Tender palpation bilateral paraspinal region in the lumbar area.  Straight leg raise negative bilaterally.  Skin:    General: Skin is warm and dry.     Capillary Refill: Capillary refill takes less than 2 seconds.  Neurological:     Mental Status: She is alert.  Psychiatric:        Mood and Affect: Mood normal.     ED Results / Procedures / Treatments   Labs (all labs ordered are listed, but only abnormal results are displayed) Labs Reviewed  URINALYSIS, [place], URINE    EKG None  Radiology No results found.  Procedures Procedures    Medications Ordered in ED Medications  ketorolac (TORADOL) 30 MG/ML injection 30 mg (30 mg Intramuscular Given 11/27/22 1037)    ED Course/ Medical Decision Making/ A&P Clinical Course as of 11/27/22 1136  Thu Nov 27, 2022  1014 Tuesday movement on couch left back pain Tylenol, percocet, methocarb, alkaseltzer  [CR]    Clinical Course User Index [CR] Wilnette Kales, PA                             Medical Decision Making Amount and/or Complexity of Data Reviewed Labs: ordered.  Risk Prescription drug management.   This patient presents to the ED for concern of back pain, this involves an extensive number of treatment options, and is a complaint that carries with it a high risk of complications and morbidity.  The differential diagnosis includes fracture, strain/sprain, dislocation, spinal epidural abscess, cauda equina, pyelonephritis, nephrolithiasis, aortic dissection   Co morbidities that complicate the patient evaluation  See HPI   Additional history obtained:  Additional history obtained from EMR External records from outside source obtained and reviewed including  records   Lab Tests:  I Ordered, and personally interpreted labs.  The pertinent results include: UA without abnormalities.  Pregnancy urine negative.   Imaging Studies ordered:  N/a   Cardiac Monitoring: / EKG:  The patient was maintained on a cardiac monitor.  I personally viewed and interpreted the cardiac monitored which showed an underlying rhythm of: Sinus rhythm   Consultations Obtained:  N/a   Problem List / ED Course / Critical interventions / Medication management  Low back pain I ordered medication including Toradol   Reevaluation of the patient after these medicines showed that the patient improved I have reviewed the patients home medicines and have made adjustments as needed   Social Determinants of Health:  Denies tobacco, illicit drug use   Test / Admission - Considered:  Low back  pain Vitals signs within normal range and stable throughout visit. Laboratory/imaging studies significant for: See above Patient's low back pain most likely muscular etiology.  No evidence clinically of acute fracture or dislocation.  Patient neurovascularly intact distally so low suspicion for cauda equina, spinal epidural abscess.  No clinical indication for sciatica.  Doubt intra-abdominal pathology including pyelonephritis, nephrolithiasis given lack of findings on UA as well as bilateral nature of back pain and history of onset.  Will treat with NSAIDs as well as muscle relaxer as needed.  Patient given rehabilitation exercises as well.  Patient recommended follow-up with primary care for reassessment of symptoms.  Treatment plan discussed at length with patient and she acknowledged understanding was agreeable to said plan. Worrisome signs and symptoms were discussed with the patient, and the patient acknowledged understanding to return to the ED if noticed. Patient was stable upon discharge.          Final Clinical Impression(s) / ED Diagnoses Final diagnoses:   Acute bilateral low back pain without sciatica    Rx / DC Orders ED Discharge Orders          Ordered    cyclobenzaprine (FLEXERIL) 10 MG tablet  2 times daily PRN        11/27/22 1044    ibuprofen (ADVIL) 600 MG tablet  Every 6 hours PRN        11/27/22 1044              Wilnette Kales, [place] 11/27/22 1137    Varney Biles, MD 11/27/22 1257

## 2023-04-13 ENCOUNTER — Ambulatory Visit (INDEPENDENT_AMBULATORY_CARE_PROVIDER_SITE_OTHER): Payer: Managed Care, Other (non HMO) | Admitting: Dermatology

## 2023-04-13 ENCOUNTER — Encounter: Payer: Self-pay | Admitting: Dermatology

## 2023-04-13 VITALS — BP 124/85

## 2023-04-13 DIAGNOSIS — L668 Other cicatricial alopecia: Secondary | ICD-10-CM

## 2023-04-13 MED ORDER — SAFETY SEAL MISCELLANEOUS MISC: 1.0000 | Freq: Every morning | 4 refills | Status: DC

## 2023-04-13 MED ORDER — SAFETY SEAL MISCELLANEOUS MISC: 1.0000 | Freq: Every evening | 2 refills | Status: DC

## 2023-04-13 MED ORDER — SAFETY SEAL MISCELLANEOUS MISC: 1.0000 | Freq: Every evening | 2 refills | Status: AC

## 2023-04-13 NOTE — Progress Notes (Signed)
   New Patient Visit   Subjective  Sarah Rios is a 37 y.o. female who presents for the following: A patch of Hair loss at the top and posterior of the scalp x 3 months. She reports initially it was itchy. She got a relaxer in March but the itching was prior to the relaxer. She uses Sulpher 8 medicated shampoo as needed.    The following portions of the chart were reviewed this encounter and updated as appropriate: medications, allergies, medical history  Review of Systems:  No other skin or systemic complaints except as noted in HPI or Assessment and Plan.  Objective  Well appearing patient in no apparent distress; mood and affect are within normal limits.       A focused examination was performed of the following aras: scalp  Relevant exam findings are noted in the Assessment and Plan.    Assessment & Plan   Central Centrifugal Cicatricial Alopecia (CCCA)  Hair loss with clinical signs of scarring  The goal of therapy is to halt progression of disease and prevent further hair loss. In areas where the hair follicle has been replaced with fibrosis, regrowth is not possible. As the exact cause is not known, targeted therapy for CCCA is not available.  Treatment options for CCCA include anti-inflammatory agents such as:  -Potent topical steroids (eg clobetasol) or intralesional steroids -Calcineurin inhibitors: tacrolimus ointment, pimecrolimus cream -Tetracyclines (eg doxycycline 100 mg twice daily, taken for several weeks to months) -Hydroxychloroquine -Hair transplantation can be considered in individuals with well-controlled CCCA for at least one year. However, graft survival is low.  Discontinuation of traumatic hair care practices is an essential aspect of treatment of CCCA.--> Pt currently natural and does not relax her hair or wear tight styles  Continue to Avoid tight braids and weaves/extensions Continue Avoid hair style practices associated with discomfort,  scalp irritation or scale Hair transplantation can be considered in individuals with well-controlled CCCA that has been stable for at least one year. However, graft survival is low  Treatment Plan:  -Minoxidil/Clobetasol gel daily -Advised Vital Proteins,  -Viviscal Supplements, and SEEN shampoo  Return in about 4 months (around 08/14/2023) for Alopecia Follow Up.  Jaclynn Guarneri, CMA, am acting as scribe for [employer], DO.   Documentation: I have reviewed the above documentation for accuracy and completeness, and I agree with the above.  Langston Reusing, DO

## 2023-04-13 NOTE — Patient Instructions (Addendum)
Thank you for visiting my office today. I appreciate your commitment to addressing your hair loss concerns and am here to support you through your treatment.  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   - Use Minoxidil to stimulate hair regrowth, available from [facility], applied once in the morning.  - Shampoo and Hair Care:   - Continue using Sulfur 8 shampoo for its anti-inflammatory properties.   - Switch to Seen shampoo and conditioner for regular use, focusing on the hydrating shampoo and deep conditioner.  - Hair Styling:   - Avoid chemical relaxers completely to prevent further inflammation.   - Opt for protective styles like braids, sew-ins, or wigs, ensuring you can still apply the topical treatments to your scalp.  - Supplements for Hair Growth:   - Start taking Viviscal supplements for hair growth, which have passed independent reviews for quality.   - Incorporate Vital Proteins collagen into your diet, available in powder form for easy addition to coffee or smoothies.  - Sun Protection:   - Use Neutrogena mineral sunscreen for the face and Eucerin Sensitive Mineral sunscreen for the body during intense sun exposure.   - For everyday use, consider Black Girl Sunscreen, although it may not be sufficient for prolonged outdoor activities.  Please remember that it usually takes about four months to see noticeable improvements in hair growth. We will monitor your progress closely and consider injectable treatments if necessary. Your after-visit summary includes all the details we discussed today.  Thank you once again for your proactive approach to your health. Please do not hesitate to contact my office if you have any  questions or need further assistance.                   Due to recent changes in healthcare laws, you may see results of your pathology and/or laboratory studies on MyChart before the doctors have had a chance to review them. We understand that in some cases there may be results that are confusing or concerning to you. Please understand that not all results are received at the same time and often the doctors may need to interpret multiple results in order to provide you with the best plan of care or course of treatment. Therefore, we ask that you please give [place] 2 business days to thoroughly review all your results before contacting the office for clarification. Should we see a critical lab result, you will be contacted sooner.   If You Need Anything After Your Visit  If you have any questions or concerns for your doctor, please call our main line at [phone] If no one answers, please leave a voicemail as directed and we will return your call as soon as possible. Messages left after 4 pm will be answered the following business day.   You may also send [place] a message via MyChart. We typically respond to MyChart messages within 1-2 business days.  For prescription refills, please ask your pharmacy to contact our office. Our fax number is [phone].  If you have an urgent issue when the clinic is closed that cannot wait until the next business day, you can page your doctor at the number below.    Please note that while we do our best to be  available for urgent issues outside of office hours, we are not available 24/7.   If you have an urgent issue and are unable to reach [place], you may choose to seek medical care at your doctor's office, retail clinic, urgent care center, or emergency room.  If you have a medical emergency, please immediately call 911 or go to the emergency department. In the event of inclement weather, please call our main line at [phone] for an update on the  status of any delays or closures.  Dermatology Medication Tips: Please keep the boxes that topical medications come in in order to help keep track of the instructions about where and how to use these. Pharmacies typically print the medication instructions only on the boxes and not directly on the medication tubes.   If your medication is too expensive, please contact our office at [phone] or send [place] a message through MyChart.   We are unable to tell what your co-pay for medications will be in advance as this is different depending on your insurance coverage. However, we may be able to find a substitute medication at lower cost or fill out paperwork to get insurance to cover a needed medication.   If a prior authorization is required to get your medication covered by your insurance company, please allow [place] 1-2 business days to complete this process.  Drug prices often vary depending on where the prescription is filled and some pharmacies may offer cheaper prices.  The website www.goodrx.com contains coupons for medications through different pharmacies. The prices here do not account for what the cost may be with help from insurance (it may be cheaper with your insurance), but the website can give you the price if you did not use any insurance.  - You can print the associated coupon and take it with your prescription to the pharmacy.  - You may also stop by our office during regular business hours and pick up a GoodRx coupon card.  - If you need your prescription sent electronically to a different pharmacy, notify our office through [facility] or by phone at [phone]

## 2023-04-20 ENCOUNTER — Encounter: Payer: Self-pay | Admitting: Dermatology

## 2023-04-20 ENCOUNTER — Telehealth: Payer: Self-pay | Admitting: Dermatology

## 2023-04-20 NOTE — Telephone Encounter (Signed)
Patient called in to advise about her medication, she received the compound but not the droplets, so she was requesting a call back to speak about her medications and what is to be expected.  [phone]

## 2023-04-20 NOTE — Telephone Encounter (Signed)
Spoke with pt and advised the Combination medication was sent to [facility]. I advised that it will be one medication but it is a compound medication that contains the Minoxidil and Clobetasol in a Gel form. I also reiterated that Dr. Onalee Hua recommended starting  starting Vivascal Supplements and Vital Protein Collagen Powder daily. Patient voiced her understanding.

## 2023-04-22 NOTE — Addendum Note (Signed)
Addended byWaynetta Pean on: 04/22/2023 08:35 AM   Modules accepted: Orders

## 2023-06-29 ENCOUNTER — Ambulatory Visit: Payer: Managed Care, Other (non HMO) | Admitting: Dermatology

## 2023-08-11 ENCOUNTER — Ambulatory Visit: Payer: Managed Care, Other (non HMO) | Admitting: Dermatology

## 2024-04-25 ENCOUNTER — Other Ambulatory Visit: Payer: Self-pay

## 2024-04-25 ENCOUNTER — Ambulatory Visit
Admission: EM | Admit: 2024-04-25 | Discharge: 2024-04-25 | Disposition: A | Discharge status: Home / Self Care | Attending: Family Medicine | Admitting: Family Medicine

## 2024-04-25 DIAGNOSIS — Z113 Encounter for screening for infections with a predominantly sexual mode of transmission: Secondary | ICD-10-CM | POA: Diagnosis present

## 2024-04-25 DIAGNOSIS — J029 Acute pharyngitis, unspecified: Secondary | ICD-10-CM | POA: Diagnosis present

## 2024-04-25 DIAGNOSIS — J020 Streptococcal pharyngitis: Secondary | ICD-10-CM | POA: Insufficient documentation

## 2024-04-25 LAB — POCT RAPID STREP A (OFFICE): Rapid Strep A Screen: POSITIVE — AB

## 2024-04-25 LAB — POCT URINE PREGNANCY: Preg Test, Ur: NEGATIVE

## 2024-04-25 MED ORDER — AMOXICILLIN 500 MG PO CAPS: 500.0000 mg | ORAL_CAPSULE | Freq: Two times a day (BID) | ORAL | 0 refills | Status: DC

## 2024-04-25 NOTE — Discharge Instructions (Addendum)
 You have tested positive for strep throat.  Start amoxicillin  twice daily for 10 days.  You may do salt water gargles and warm liquids such as teas and honey.  Over-the-counter Tylenol  or ibuprofen  as needed.  The clinical contact you with results of the testing done today if positive.  Please follow-up with your PCP if your symptoms do not improve.  Please go to the ER for any worsening symptoms.  Hope you feel better soon!

## 2024-04-25 NOTE — ED Provider Notes (Signed)
 [facility]    CSN: [id number] Arrival date & time: 04/25/24  1744      History   Chief Complaint No chief complaint on file.   HPI Sarah Rios is a 38 y.o. female  presents for evaluation of URI symptoms for 3 days. Patient reports associated symptoms of sore throat with some congestion, ear pain, mild cough. Denies N/V/D, fevers, body aches, shortness of breath. Patient does not have a hx of asthma. Patient does vape.  Reports no known sick contacts.  She would like STD screening as well.  No known STD exposure.  No dysuria.  She is also requesting a pregnancy test as she is 1 day late for her menses.  Pt has taken Tylenol  OTC for symptoms. Pt has no other concerns at this time.    HPI  Past Medical History:  Diagnosis Date   Anemia    Hypertension     There are no active problems to display for this patient.   Past Surgical History:  Procedure Laterality Date   CESAREAN SECTION     LAPAROSCOPY Right 09/24/2018   Procedure: LAPAROSCOPY  WITH RIGHT SALPINGECTOMY AND REMOVAL OF ECTOPIC PREGNANCY;  Surgeon: Herchel Gloris LABOR, MD;  Location: [facility];  Service: Gynecology;  Laterality: Right;    OB History     Gravida  5   Para  2   Term  2   Preterm      AB  2   Living  2      SAB  1   IAB  1   Ectopic      Multiple      Live Births               Home Medications    Prior to Admission medications   Medication Sig Start Date End Date Taking? Authorizing Provider  amoxicillin  (AMOXIL ) 500 MG capsule Take 1 capsule (500 mg total) by mouth 2 (two) times daily for 10 days. 04/25/24 05/05/24 Yes Marisa Hage, Jodi R, NP  amLODipine (NORVASC) 10 MG tablet Take 10 mg by mouth daily.    [provider]  cyclobenzaprine  (FLEXERIL ) 10 MG tablet Take 1 tablet (10 mg total) by mouth 2 (two) times daily as needed for muscle spasms. 11/27/22   Silver Wonda LABOR, PA  docusate sodium  (COLACE) 100 MG capsule Take 1 capsule (100 mg total) by mouth 2  (two) times daily as needed for mild constipation or moderate constipation. Patient not taking: Reported on 10/13/2018 09/24/18   Anyanwu, Ugonna A, MD  FLUoxetine (PROZAC) 20 MG capsule Take 1 capsule by mouth daily. 03/29/20   [provider]  hydrochlorothiazide (HYDRODIURIL) 25 MG tablet Take by mouth. 08/17/18 08/17/19  [provider]  ibuprofen  (ADVIL ) 600 MG tablet Take 1 tablet (600 mg total) by mouth every 6 (six) hours as needed. 11/27/22   Silver Wonda LABOR, PA  labetalol (NORMODYNE) 100 MG tablet Take by mouth. 05/22/20   [provider]  lidocaine  (LIDODERM ) 5 % Place 1 patch onto the skin daily. Remove & Discard patch within 12 hours or as directed by MD 06/24/20   Henderly, Britni A, PA-C  methocarbamol  (ROBAXIN ) 500 MG tablet Take 1 tablet (500 mg total) by mouth 2 (two) times daily. 06/24/20   Henderly, Britni A, PA-C  metoprolol  succinate (TOPROL -XL) 100 MG 24 hr tablet Take 100 mg by mouth daily. Take with or immediately following a meal.    [provider]  Safety Seal  Miscellaneous MISC Apply 1 Application topically at bedtime. AA gel 04/13/23   Alm Delon SAILOR, DO    Family History Family History  Adopted: Yes    Social History Social History   Tobacco Use   Smoking status: Never   Smokeless tobacco: Never  Vaping Use   Vaping status: Never Used  Substance Use Topics   Alcohol use: Yes    Comment: occa   Drug use: Yes    Types: Marijuana    Comment: ocassional     Allergies   Patient has no known allergies.   Review of Systems Review of Systems  HENT:  Positive for congestion and sore throat.   Respiratory:  Positive for cough.   Genitourinary:  Positive for menstrual problem.     Physical Exam Triage Vital Signs ED Triage Vitals  Encounter Vitals Group     BP 04/25/24 1758 139/88     Girls Systolic BP Percentile --      Girls Diastolic BP Percentile --      Boys Systolic BP Percentile --      Boys Diastolic BP  Percentile --      Pulse Rate 04/25/24 1758 (!) 49     Resp 04/25/24 1758 16     Temp 04/25/24 1758 98.7 F (37.1 C)     Temp Source 04/25/24 1758 Oral     SpO2 04/25/24 1758 97 %     Weight --      Height --      Head Circumference --      Peak Flow --      Pain Score 04/25/24 1755 4     Pain Loc --      Pain Education --      Exclude from Growth Chart --    No data found.  Updated Vital Signs BP 139/88   Pulse (!) 49   Temp 98.7 F (37.1 C) (Oral)   Resp 16   LMP 03/29/2024   SpO2 97%   Breastfeeding No   Visual Acuity Right Eye Distance:   Left Eye Distance:   Bilateral Distance:    Right Eye Near:   Left Eye Near:    Bilateral Near:     Physical Exam Vitals and nursing note reviewed.  Constitutional:      General: She is not in acute distress.    Appearance: She is well-developed. She is not ill-appearing.  HENT:     Head: Normocephalic and atraumatic.     Right Ear: Tympanic membrane and ear canal normal.     Left Ear: Tympanic membrane and ear canal normal.     Nose: Congestion present.     Mouth/Throat:     Mouth: Mucous membranes are moist.     Pharynx: Oropharynx is clear. Uvula midline. Posterior oropharyngeal erythema present.     Tonsils: No tonsillar exudate or tonsillar abscesses.  Eyes:     Conjunctiva/sclera: Conjunctivae normal.     Pupils: Pupils are equal, round, and reactive to light.  Cardiovascular:     Rate and Rhythm: Regular rhythm. Bradycardia present.     Heart sounds: Normal heart sounds.     Comments: Heart rate 49 on intake, recheck 58 Pulmonary:     Effort: Pulmonary effort is normal.     Breath sounds: Normal breath sounds. No wheezing, rhonchi or rales.  Musculoskeletal:     Cervical back: Normal range of motion and neck supple.  Lymphadenopathy:     Cervical: No cervical adenopathy.  Skin:    General: Skin is warm and dry.  Neurological:     General: No focal deficit present.     Mental Status: She is alert and  oriented to person, place, and time.  Psychiatric:        Mood and Affect: Mood normal.        Behavior: Behavior normal.      UC Treatments / Results  Labs (all labs ordered are listed, but only abnormal results are displayed) Labs Reviewed  POCT RAPID STREP A (OFFICE) - Abnormal; Notable for the following components:      Result Value   Rapid Strep A Screen Positive (*)    All other components within normal limits  RPR  HIV ANTIBODY (ROUTINE TESTING W REFLEX)  POCT URINE PREGNANCY  CERVICOVAGINAL ANCILLARY ONLY  CYTOLOGY, (ORAL, ANAL, URETHRAL) ANCILLARY ONLY    EKG   Radiology No results found.  Procedures Procedures (including critical care time)  Medications Ordered in UC Medications - No data to display  Initial Impression / Assessment and Plan / UC Course  I have reviewed the triage vital signs and the nursing notes.  Pertinent labs & imaging results that were available during my care of the patient were reviewed by me and considered in my medical decision making (see chart for details).  Clinical Course as of 04/25/24 1825  Mon Apr 25, 2024  1812 Heart rate recheck 58 [JM]    Clinical Course User Index [JM] Loreda Myla SAUNDERS, NP    STD testing is ordered we will contact for any positive results.  hCG negative.  Positive rapid strep, start amoxicillin .  Discussed rest fluids and OTC analgesics as needed.  PCP follow-up if symptoms do not improve.  ER precautions reviewed. Final Clinical Impressions(s) / UC Diagnoses   Final diagnoses:  Sore throat  Screening examination for STD (sexually transmitted disease)  Streptococcal sore throat     Discharge Instructions      You have tested positive for strep throat.  Start amoxicillin  twice daily for 10 days.  You may do salt water gargles and warm liquids such as teas and honey.  Over-the-counter Tylenol  or ibuprofen  as needed.  The clinical contact you with results of the testing done today if positive.   Please follow-up with your PCP if your symptoms do not improve.  Please go to the ER for any worsening symptoms.  Hope you feel better soon!     ED Prescriptions     Medication Sig Dispense Auth. Provider   amoxicillin  (AMOXIL ) 500 MG capsule Take 1 capsule (500 mg total) by mouth 2 (two) times daily for 10 days. 20 capsule Chavis Tessler, Jodi R, NP      PDMP not reviewed this encounter.   Loreda Myla SAUNDERS, NP 04/25/24 [phone]

## 2024-04-25 NOTE — ED Triage Notes (Signed)
 Pt c/o sore throatx3d. Pt states she gave oral sex this weekend

## 2024-04-26 LAB — CYTOLOGY, (ORAL, ANAL, URETHRAL) ANCILLARY ONLY
Chlamydia: NEGATIVE
Comment: NEGATIVE
Comment: NORMAL
Neisseria Gonorrhea: NEGATIVE

## 2024-04-26 LAB — CERVICOVAGINAL ANCILLARY ONLY
Chlamydia: NEGATIVE
Comment: NEGATIVE
Comment: NEGATIVE
Comment: NORMAL
Neisseria Gonorrhea: NEGATIVE
Trichomonas: NEGATIVE

## 2024-04-26 LAB — RPR: RPR Ser Ql: NONREACTIVE

## 2024-04-26 LAB — HIV ANTIBODY (ROUTINE TESTING W REFLEX): HIV Screen 4th Generation wRfx: NONREACTIVE

## 2024-04-27 ENCOUNTER — Telehealth: Payer: Self-pay | Admitting: Urgent Care

## 2024-04-27 DIAGNOSIS — J020 Streptococcal pharyngitis: Secondary | ICD-10-CM

## 2024-04-27 MED ORDER — AMOXICILLIN 500 MG PO CAPS: 500.0000 mg | ORAL_CAPSULE | Freq: Two times a day (BID) | ORAL | 0 refills | Status: AC

## 2024-04-27 NOTE — Telephone Encounter (Signed)
 Patient lost her medication of amoxicillin .  Requested a refill.  Sent this to her pharmacy on file now.  Please let the patient know.
